# Patient Record
Sex: Male | Born: 1955
Health system: Southern US, Community
[De-identification: ages and names within clinical notes are randomized; demographics above are authoritative.]

## PROBLEM LIST (undated history)

## (undated) ENCOUNTER — Emergency Department (HOSPITAL_COMMUNITY): Payer: BLUE CROSS/BLUE SHIELD | Source: Home / Self Care

## (undated) DIAGNOSIS — E7800 Pure hypercholesterolemia, unspecified: Secondary | ICD-10-CM

---

## 2011-12-10 ENCOUNTER — Other Ambulatory Visit (HOSPITAL_COMMUNITY): Payer: Self-pay | Admitting: Rheumatology

## 2011-12-10 ENCOUNTER — Ambulatory Visit (HOSPITAL_COMMUNITY)
Admission: RE | Admit: 2011-12-10 | Discharge: 2011-12-10 | Disposition: A | Discharge status: Home / Self Care | Payer: BC Managed Care – PPO | Source: Ambulatory Visit | Attending: Rheumatology | Admitting: Rheumatology

## 2011-12-10 DIAGNOSIS — M545 Low back pain, unspecified: Secondary | ICD-10-CM

## 2011-12-10 DIAGNOSIS — M47817 Spondylosis without myelopathy or radiculopathy, lumbosacral region: Secondary | ICD-10-CM | POA: Insufficient documentation

## 2013-04-30 NOTE — H&P (Signed)
  NTS SOAP Note  Vital Signs:  Vitals as of: 04/30/2013: Systolic 136: Diastolic 86: Heart Rate 82: Temp 98.78F: Height 585ft 11in: Weight 229Lbs 0 Ounces: Pain Level 6: BMI 31.94  BMI : 31.94 kg/m2  Subjective: This [age] Years 7510 Months old Male presents for of    HERNIA: ,Has had a left inguinal hernia for some time now, but is increasing in size and causing him discomfort.  Does reduce on own when lying down.  Review of Symptoms:  Constitutional:  fatigue Head:unremarkable    Eyes:unremarkable   sinus problems Cardiovascular:  unremarkable   Respiratory:  dyspnea Gastrointestinal:  unremarkable   Genitourinary:unremarkable       joint pain dry Hematolgic/Lymphatic:unremarkable       hay fever   Past Medical History:    Reviewed  Past Medical History  Surgical History: right hand infection Medical Problems: psoriasis, high cholesterol Allergies: morphine Medications: embrel (which he is holding), crestor   Social History:Reviewed  Social History  Preferred Language: English Race:  White Ethnicity: Not Hispanic / Latino Age: [age] Years 10 Months Marital Status:  M Alcohol: occassionally Recreational drug(s): no   Smoking Status: Current every day smoker reviewed on 04/30/2013 Started Date: 04/03/1975 Packs per day: 1.00 Functional Status reviewed on 04/30/2013 ------------------------------------------------ Bathing: Normal Cooking: Normal Dressing: Normal Driving: Normal Eating: Normal Managing Meds: Normal Oral Care: Normal Shopping: Normal Toileting: Normal Transferring: Normal Walking: Normal Cognitive Status reviewed on 04/30/2013 ------------------------------------------------ Attention: Normal Decision Making: Normal Language: Normal Memory: Normal Motor: Normal Perception: Normal Problem Solving: Normal Visual and Spatial: Normal   Family History:  Reviewed  Family Health History Mother,  Living; Breast cancer;  Father, Living; Diabetes mellitus, unspecified type;     Objective Information: General:  Well appearing, well nourished in no distress. Heart:  RRR, no murmur Lungs:    CTA bilaterally, no wheezes, rhonchi, rales.  Breathing unlabored. Abdomen:Soft, NT/ND, normal bowel sounds, no HSM, no masses.  No peritoneal signs.  Large reducible left inguinal hernia.   WG:[id number]:unremarkable    Assessment:Left inguinal hernia  Diagnoses: 550.90 Inguinal hernia (Unilateral inguinal hernia, without obstruction or gangrene, not specified as recurrent)  Procedures: [id number] - OFFICE OUTPATIENT NEW 30 MINUTES    Plan:Scheduled for left inguinal herniorrhaphy with mesh on 05/11/13.   Patient Education:Alternative treatments to surgery were discussed with patient (and family).  Risks and benefits  of procedure bleeding, infection, mesh use, and the possibility of recurrence of the hernia were fully explained to the patient (and family) who gave informed consent. Patient/family questions were addressed.  Follow-up:Pending Surgery

## 2013-05-04 ENCOUNTER — Encounter (HOSPITAL_COMMUNITY): Payer: Self-pay | Admitting: Pharmacy Technician

## 2013-05-04 NOTE — Patient Instructions (Signed)
Marcha SoldersCharles C Granada  05/04/2013   Your procedure is scheduled on:  Monday,May 11, 2013  [facility] at [id number].  Call this number if you have problems the morning of surgery: [phone]   Remember:   Do not eat food or drink liquids after midnight.   Take these medicines the morning of surgery with A SIP OF WATER:   Do not wear jewelry, make-up or nail polish.  Do not wear lotions, powders, or perfumes. You may wear deodorant.  Do not shave 48 hours prior to surgery. Men may shave face and neck.  Do not bring valuables to the hospital.  [facility] is not responsible for any belongings or valuables.               Contacts, dentures or bridgework may not be worn into surgery.  Leave suitcase in the car. After surgery it may be brought to your room.  For patients admitted to the hospital, discharge time is determined by your treatment team.               Patients discharged the day of surgery will not be allowed to drive home.  Name and phone number of your driver: Family or friend  Special Instructions: Shower using CHG 2 nights before surgery and the night before surgery.  If you shower the day of surgery use CHG.  Use special wash - you have one bottle of CHG for all showers.  You should use approximately 1/3 of the bottle for each shower.   Please read over the following fact sheets that you were given: Pain Booklet, Coughing and Deep Breathing, MRSA Information, Surgical Site Infection Prevention, Anesthesia Post-op Instructions and Care and Recovery After Surgery  PATIENT INSTRUCTIONS POST-ANESTHESIA  IMMEDIATELY FOLLOWING SURGERY:  Do not drive or operate machinery for the first twenty four hours after surgery.  Do not make any important decisions for twenty four hours after surgery or while taking narcotic pain medications or sedatives.  If you develop intractable nausea and vomiting or a severe headache please notify your doctor immediately.  FOLLOW-UP:  Please make  an appointment with your surgeon as instructed. You do not need to follow up with anesthesia unless specifically instructed to do so.  WOUND CARE INSTRUCTIONS (if applicable):  Keep a dry clean dressing on the anesthesia/puncture wound site if there is drainage.  Once the wound has quit draining you may leave it open to air.  Generally you should leave the bandage intact for twenty four hours unless there is drainage.  If the epidural site drains for more than 36-48 hours please call the anesthesia department.  QUESTIONS?:  Please feel free to call your physician or the hospital operator if you have any questions, and they will be happy to assist you.     Hernia Repair Care After These instructions give you information on caring for yourself after your procedure. Your doctor may also give you more specific instructions. Call your doctor if you have any problems or questions after your procedure. HOME CARE   You may have changes in your poops (bowel movements).  You may have loose or watery poop (diarrhea).  You may be not able to poop.  Your bowels will slowly get back to normal.  Do not eat any food that makes you sick to your stomach (nauseous). Eat small meals 4 to 6 times a day instead of 3 large ones.  Do not drink pop. It will give you gas.  Do not drink alcohol.  Do not lift anything heavier than 10 pounds. This is about the weight of a gallon of milk.  Do not do anything that makes you very tired for at least 6 weeks.  Do not get your wound wet for 2 days.  You may take a sponge bath during this time.  After 2 days you may take a shower. Gently pat your surgical cut (incision) dry with a towel. Do not rub it.  For men: You may have been given an athletic supporter (scrotal support) before you left the hospital. It holds your scrotum and testicles closer to your body so there is no strain on your wound. Wear the supporter until your doctor tells you that you do not need it  anymore. GET HELP RIGHT AWAY IF:  You have watery poop, or cannot poop for more than 3 days.  You feel sick to your stomach or throw up (vomit) more than 2 or 3 times.  You have temperature by mouth above 102 F (38.9 C).  You see redness or puffiness (swelling) around your wound.  You see yellowish white fluid (pus) coming from your wound.  You see a bulge or bump in your lower belly (abdomen) or near your groin.  You develop a rash, trouble breathing, or any other symptoms from medicines taken. MAKE SURE YOU:  Understand these instructions.  Will watch your condition.  Will get help right away if your are not doing well or get worse.  Exit[employer] Patient Information 2014 [place], [place].

## 2013-05-05 ENCOUNTER — Encounter (HOSPITAL_COMMUNITY): Payer: Self-pay

## 2013-05-05 ENCOUNTER — Encounter (HOSPITAL_COMMUNITY)
Admission: RE | Admit: 2013-05-05 | Discharge: 2013-05-05 | Disposition: A | Discharge status: Home / Self Care | Payer: BC Managed Care – PPO | Source: Ambulatory Visit | Attending: General Surgery | Admitting: General Surgery

## 2013-05-05 DIAGNOSIS — Z01812 Encounter for preprocedural laboratory examination: Secondary | ICD-10-CM | POA: Insufficient documentation

## 2013-05-05 LAB — CBC WITH DIFFERENTIAL/PLATELET
Basophils Absolute: 0 10*3/uL (ref 0.0–0.1)
Basophils Relative: 1 % (ref 0–1)
Eosinophils Absolute: 0.3 10*3/uL (ref 0.0–0.7)
Eosinophils Relative: 4 % (ref 0–5)
HEMATOCRIT: 44 % (ref 39.0–52.0)
Hemoglobin: 15.4 g/dL (ref 13.0–17.0)
Lymphocytes Relative: 32 % (ref 12–46)
Lymphs Abs: 1.9 10*3/uL (ref 0.7–4.0)
MCH: 31.4 pg (ref 26.0–34.0)
MCHC: 35 g/dL (ref 30.0–36.0)
MCV: 89.6 fL (ref 78.0–100.0)
Monocytes Absolute: 0.4 10*3/uL (ref 0.1–1.0)
Monocytes Relative: 7 % (ref 3–12)
NEUTROS ABS: 3.3 10*3/uL (ref 1.7–7.7)
NEUTROS PCT: 56 % (ref 43–77)
Platelets: 180 10*3/uL (ref 150–400)
RBC: 4.91 MIL/uL (ref 4.22–5.81)
RDW: 13.1 % (ref 11.5–15.5)
WBC: 5.8 10*3/uL (ref 4.0–10.5)

## 2013-05-05 LAB — BASIC METABOLIC PANEL
BUN: 12 mg/dL (ref 6–23)
CO2: 26 mEq/L (ref 19–32)
CREATININE: 0.78 mg/dL (ref 0.50–1.35)
Calcium: 9.6 mg/dL (ref 8.4–10.5)
Chloride: 100 mEq/L (ref 96–112)
Glucose, Bld: 121 mg/dL — ABNORMAL HIGH (ref 70–99)
Potassium: 4.6 mEq/L (ref 3.7–5.3)
Sodium: 141 mEq/L (ref 137–147)

## 2013-05-11 ENCOUNTER — Ambulatory Visit (HOSPITAL_COMMUNITY)
Admission: RE | Admit: 2013-05-11 | Discharge: 2013-05-11 | Disposition: A | Discharge status: Home / Self Care | Payer: BC Managed Care – PPO | Source: Ambulatory Visit | Attending: General Surgery | Admitting: General Surgery

## 2013-05-11 ENCOUNTER — Encounter (HOSPITAL_COMMUNITY)
Admission: RE | Disposition: A | Discharge status: Home / Self Care | Payer: Self-pay | Source: Ambulatory Visit | Attending: General Surgery

## 2013-05-11 ENCOUNTER — Encounter (HOSPITAL_COMMUNITY): Payer: BC Managed Care – PPO | Admitting: Anesthesiology

## 2013-05-11 ENCOUNTER — Encounter (HOSPITAL_COMMUNITY): Payer: Self-pay | Admitting: *Deleted

## 2013-05-11 ENCOUNTER — Ambulatory Visit (HOSPITAL_COMMUNITY): Payer: BC Managed Care – PPO | Admitting: Anesthesiology

## 2013-05-11 DIAGNOSIS — F17200 Nicotine dependence, unspecified, uncomplicated: Secondary | ICD-10-CM | POA: Insufficient documentation

## 2013-05-11 DIAGNOSIS — K4090 Unilateral inguinal hernia, without obstruction or gangrene, not specified as recurrent: Secondary | ICD-10-CM | POA: Insufficient documentation

## 2013-05-11 HISTORY — PX: INSERTION OF MESH: SHX5868

## 2013-05-11 HISTORY — PX: INGUINAL HERNIA REPAIR: SHX194

## 2013-05-11 SURGERY — REPAIR, HERNIA, INGUINAL, ADULT
Anesthesia: General | Site: Groin | Laterality: Left

## 2013-05-11 MED ORDER — MIDAZOLAM HCL 5 MG/5ML IJ SOLN
INTRAMUSCULAR | Status: DC | PRN
  Administered 2013-05-11: 2 mg via INTRAVENOUS

## 2013-05-11 MED ORDER — FENTANYL CITRATE 0.05 MG/ML IJ SOLN
INTRAMUSCULAR | Status: DC | PRN
  Administered 2013-05-11 (×2): 25 ug via INTRAVENOUS
  Administered 2013-05-11 (×2): 50 ug via INTRAVENOUS
  Administered 2013-05-11 (×2): 25 ug via INTRAVENOUS

## 2013-05-11 MED ORDER — SODIUM CHLORIDE 0.9 % IR SOLN
Status: DC | PRN
  Administered 2013-05-11: 1000 mL

## 2013-05-11 MED ORDER — CHLORHEXIDINE GLUCONATE 4 % EX LIQD: 1.0000 "application " | Freq: Once | CUTANEOUS | Status: DC

## 2013-05-11 MED ORDER — LACTATED RINGERS IV SOLN
INTRAVENOUS | Status: DC
  Administered 2013-05-11: 1000 mL via INTRAVENOUS

## 2013-05-11 MED ORDER — FENTANYL CITRATE 0.05 MG/ML IJ SOLN: 25.0000 ug | INTRAMUSCULAR | Status: DC | PRN

## 2013-05-11 MED ORDER — GLYCOPYRROLATE 0.2 MG/ML IJ SOLN
0.2000 mg | Freq: Once | INTRAMUSCULAR | Status: AC
  Administered 2013-05-11: 0.2 mg via INTRAVENOUS

## 2013-05-11 MED ORDER — CEFAZOLIN SODIUM-DEXTROSE 2-3 GM-% IV SOLR
2.0000 g | INTRAVENOUS | Status: AC
  Administered 2013-05-11: 2 g via INTRAVENOUS
  Filled 2013-05-11: qty 50

## 2013-05-11 MED ORDER — MIDAZOLAM HCL 2 MG/2ML IJ SOLN
INTRAMUSCULAR | Status: AC
  Filled 2013-05-11: qty 2

## 2013-05-11 MED ORDER — OXYCODONE-ACETAMINOPHEN 7.5-325 MG PO TABS: 1.0000 | ORAL_TABLET | ORAL | Status: AC | PRN

## 2013-05-11 MED ORDER — KETOROLAC TROMETHAMINE 30 MG/ML IJ SOLN
30.0000 mg | Freq: Once | INTRAMUSCULAR | Status: AC
  Administered 2013-05-11: 30 mg via INTRAVENOUS
  Filled 2013-05-11: qty 1

## 2013-05-11 MED ORDER — BUPIVACAINE HCL (PF) 0.5 % IJ SOLN
INTRAMUSCULAR | Status: DC | PRN
  Administered 2013-05-11: 10 mL

## 2013-05-11 MED ORDER — GLYCOPYRROLATE 0.2 MG/ML IJ SOLN
INTRAMUSCULAR | Status: AC
  Filled 2013-05-11: qty 1

## 2013-05-11 MED ORDER — LIDOCAINE HCL 1 % IJ SOLN
INTRAMUSCULAR | Status: DC | PRN
  Administered 2013-05-11: 50 mg via INTRADERMAL

## 2013-05-11 MED ORDER — FENTANYL CITRATE 0.05 MG/ML IJ SOLN
25.0000 ug | INTRAMUSCULAR | Status: AC
  Administered 2013-05-11: 25 ug via INTRAVENOUS

## 2013-05-11 MED ORDER — FENTANYL CITRATE 0.05 MG/ML IJ SOLN
INTRAMUSCULAR | Status: AC
  Filled 2013-05-11: qty 2

## 2013-05-11 MED ORDER — BUPIVACAINE HCL (PF) 0.5 % IJ SOLN
INTRAMUSCULAR | Status: AC
  Filled 2013-05-11: qty 30

## 2013-05-11 MED ORDER — ONDANSETRON HCL 4 MG/2ML IJ SOLN
4.0000 mg | Freq: Once | INTRAMUSCULAR | Status: AC
  Administered 2013-05-11: 4 mg via INTRAVENOUS

## 2013-05-11 MED ORDER — ONDANSETRON HCL 4 MG/2ML IJ SOLN
INTRAMUSCULAR | Status: AC
  Filled 2013-05-11: qty 2

## 2013-05-11 MED ORDER — PROPOFOL 10 MG/ML IV EMUL
INTRAVENOUS | Status: AC
  Filled 2013-05-11: qty 20

## 2013-05-11 MED ORDER — PROPOFOL 10 MG/ML IV BOLUS
INTRAVENOUS | Status: DC | PRN
  Administered 2013-05-11: 180 mg via INTRAVENOUS

## 2013-05-11 MED ORDER — ONDANSETRON HCL 4 MG/2ML IJ SOLN: 4.0000 mg | Freq: Once | INTRAMUSCULAR | Status: DC | PRN

## 2013-05-11 MED ORDER — MIDAZOLAM HCL 2 MG/2ML IJ SOLN
1.0000 mg | INTRAMUSCULAR | Status: DC | PRN
  Administered 2013-05-11: 2 mg via INTRAVENOUS

## 2013-05-11 SURGICAL SUPPLY — 37 items
BAG HAMPER (MISCELLANEOUS) ×3 IMPLANT
CLOTH BEACON ORANGE TIMEOUT ST (SAFETY) ×3 IMPLANT
COVER LIGHT HANDLE STERIS (MISCELLANEOUS) ×6 IMPLANT
DECANTER SPIKE VIAL GLASS SM (MISCELLANEOUS) ×3 IMPLANT
DERMABOND ADVANCED (GAUZE/BANDAGES/DRESSINGS) ×2
DERMABOND ADVANCED .7 DNX12 (GAUZE/BANDAGES/DRESSINGS) ×1 IMPLANT
DRAIN PENROSE 3/4X12 (DRAIN) ×3 IMPLANT
ELECT REM PT RETURN 9FT ADLT (ELECTROSURGICAL) ×3
ELECTRODE REM PT RTRN 9FT ADLT (ELECTROSURGICAL) ×1 IMPLANT
GLOVE BIOGEL PI IND STRL 7.0 (GLOVE) ×1 IMPLANT
GLOVE BIOGEL PI IND STRL 7.5 (GLOVE) ×1 IMPLANT
GLOVE BIOGEL PI IND STRL 8 (GLOVE) ×1 IMPLANT
GLOVE BIOGEL PI INDICATOR 7.0 (GLOVE) ×2
GLOVE BIOGEL PI INDICATOR 7.5 (GLOVE) ×2
GLOVE BIOGEL PI INDICATOR 8 (GLOVE) ×2
GLOVE ECLIPSE 7.0 STRL STRAW (GLOVE) ×6 IMPLANT
GLOVE ECLIPSE 7.5 STRL STRAW (GLOVE) ×3 IMPLANT
GLOVE EXAM NITRILE LRG STRL (GLOVE) ×3 IMPLANT
GOWN STRL REUS W/TWL LRG LVL3 (GOWN DISPOSABLE) ×9 IMPLANT
INST SET MINOR GENERAL (KITS) ×3 IMPLANT
KIT ROOM TURNOVER APOR (KITS) ×3 IMPLANT
MANIFOLD NEPTUNE II (INSTRUMENTS) ×3 IMPLANT
MESH HERNIA 1.6X1.9 PLUG LRG (Mesh General) ×1 IMPLANT
MESH HERNIA PLUG LRG (Mesh General) ×2 IMPLANT
NS IRRIG 1000ML POUR BTL (IV SOLUTION) ×3 IMPLANT
PACK MINOR (CUSTOM PROCEDURE TRAY) ×3 IMPLANT
PAD ARMBOARD 7.5X6 YLW CONV (MISCELLANEOUS) ×3 IMPLANT
SET BASIN LINEN APH (SET/KITS/TRAYS/PACK) ×3 IMPLANT
SOL PREP PROV IODINE SCRUB 4OZ (MISCELLANEOUS) ×3 IMPLANT
SUT NOVA NAB GS-22 2 2-0 T-19 (SUTURE) ×9 IMPLANT
SUT VIC AB 2-0 CT1 27 (SUTURE) ×2
SUT VIC AB 2-0 CT1 TAPERPNT 27 (SUTURE) ×1 IMPLANT
SUT VIC AB 3-0 SH 27 (SUTURE) ×2
SUT VIC AB 3-0 SH 27X BRD (SUTURE) ×1 IMPLANT
SUT VIC AB 4-0 PS2 27 (SUTURE) ×3 IMPLANT
SUT VICRYL AB 3 0 TIES (SUTURE) ×3 IMPLANT
SYR CONTROL 10ML LL (SYRINGE) ×3 IMPLANT

## 2013-05-11 NOTE — Anesthesia Postprocedure Evaluation (Signed)
  Anesthesia Post-op Note  Patient: Mark Rangel  Procedure(s) Performed: Procedure(s): HERNIA REPAIR INGUINAL ADULT (Left) INSERTION OF MESH (Left)  Patient Location: PACU  Anesthesia Type:General  Level of Consciousness: awake, alert , oriented and patient cooperative  Airway and Oxygen Therapy: Patient Spontanous Breathing  Post-op Pain: 3 /10, mild  Post-op Assessment: Post-op Vital signs reviewed, Patient's Cardiovascular Status Stable, Respiratory Function Stable, Patent Airway, No signs of Nausea or vomiting and Pain level controlled  Post-op Vital Signs: Reviewed and stable  Complications: No apparent anesthesia complications

## 2013-05-11 NOTE — Op Note (Signed)
Patient:  Mark Rangel  DOB:  20-Jun-1955  MRN:  [id number]   Preop Diagnosis:  Left inguinal hernia  Postop Diagnosis:  Same  Procedure:  Left inguinal herniorrhaphy with mesh  Surgeon:  Franky MachoMark Caleb Prigmore, M.D.  Anes:  General  Indications:  Patient is a 58 year old white male who presents with a symptomatic left inguinal hernia. The risks and benefits of the procedure including bleeding, infection, pain, and the possibility of recurrence of the hernia were fully explained to the patient, who gave informed consent.  Procedure note:  The patient was placed in the supine position. After general anesthesia was administered, the left groin region was prepped and draped using usual sterile technique with Betadine. Surgical site confirmation was performed.  A transverse incision was made the left groin region down to the external oblique aponeuroses. The aponeuroses was incised to the external ring. A Penrose drain was placed around the spermatic cord. The vas deferens was noted within the spermatic cord. The patient had a significant lipoma of the cord, but this was not fully dissected as a significant amount of the vasculature was noted to be within it. The ilioinguinal nerve was identified retracted superiorly from the operative field. The patient had a large indirect hernia sac. This was freed away from the spermatic cord and inverted at the peritoneal reflection. A large size Bard mesh PerFix plug was then inserted and secured to the transversalis fascia using 2-0 Novafil interrupted sutures. An onlay patch was then placed along the floor of inguinal canal and secured superiorly to the conjoined tendon and inferiorly to the shelving edge of Poupart's ligament using 2-0 Novafil interrupted sutures. The internal ring was recreated using a 2-0 Novafil interrupted suture. The external oblique aponeuroses was reapproximated using a 2-0 Vicryl running suture. The subcutaneous layer was reapproximated  using 3-0 Vicryl interrupted suture. The skin was closed using a 4-0 Vicryl subcuticular suture. 0.5% Sensorcaine was instilled the surrounding wound. Dermabond was then applied.  All tape and needle counts were correct at the end of the procedure. Patient was extubated in the operating room and transferred to PACU in stable condition.  Complications:  None  EBL:  Minimal  Specimen:  None

## 2013-05-11 NOTE — Discharge Instructions (Signed)
Inguinal Hernia, Adult  °Care After °Refer to this sheet in the next few weeks. These discharge instructions provide you with general information on caring for yourself after you leave the hospital. Your caregiver may also give you specific instructions. Your treatment has been planned according to the most current medical practices available, but unavoidable complications sometimes occur. If you have any problems or questions after discharge, please call your caregiver. °HOME CARE INSTRUCTIONS °· Put ice on the operative site. °· Put ice in a plastic bag. °· Place a towel between your skin and the bag. °· Leave the ice on for 15-20 minutes at a time, 03-04 times a day while awake. °· Change bandages (dressings) as directed. °· Keep the wound dry and clean. The wound may be washed gently with soap and water. Gently blot or dab the wound dry. It is okay to take showers 24 to 48 hours after surgery. Do not take baths, use swimming pools, or use hot tubs for 10 days, or as directed by your caregiver. °· Only take over-the-counter or prescription medicines for pain, discomfort, or fever as directed by your caregiver. °· Continue your normal diet as directed. °· Do not lift anything more than 10 pounds or play contact sports for 3 weeks, or as directed. °SEEK MEDICAL CARE IF: °· There is redness, swelling, or increasing pain in the wound. °· There is fluid (pus) coming from the wound. °· There is drainage from a wound lasting longer than 1 day. °· You have an oral temperature above 102° F (38.9° C). °· You notice a bad smell coming from the wound or dressing. °· The wound breaks open after the stitches (sutures) have been removed. °· You notice increasing pain in the shoulders (shoulder strap areas). °· You develop dizzy episodes or fainting while standing. °· You feel sick to your stomach (nauseous) or throw up (vomit). °SEEK IMMEDIATE MEDICAL CARE IF: °· You develop a rash. °· You have difficulty breathing. °· You  develop a reaction or have side effects to medicines you were given. °MAKE SURE YOU:  °· Understand these instructions. °· Will watch your condition. °· Will get help right away if you are not doing well or get worse. °Document Released: 04/19/2006 Document Revised: 06/11/2011 Document Reviewed: 02/16/2009 °ExitCare® Patient Information ©2014 [employer]. ° °

## 2013-05-11 NOTE — Anesthesia Preprocedure Evaluation (Signed)
Anesthesia Evaluation  Patient identified by MRN, date of birth, ID band Patient awake    Reviewed: Allergy & Precautions, H&P , NPO status , Patient's Chart, lab work & pertinent test results  Airway Mallampati: II TM Distance: >3 FB     Dental  (+) Edentulous Upper and Edentulous Lower   Pulmonary Current Smoker,  breath sounds clear to auscultation        Cardiovascular negative cardio ROS  Rhythm:Regular Rate:Normal     Neuro/Psych    GI/Hepatic negative GI ROS,   Endo/Other    Renal/GU      Musculoskeletal   Abdominal   Peds  Hematology   Anesthesia Other Findings   Reproductive/Obstetrics                           Anesthesia Physical Anesthesia Plan  ASA: II  Anesthesia Plan: General   Post-op Pain Management:    Induction: Intravenous  Airway Management Planned: LMA  Additional Equipment:   Intra-op Plan:   Post-operative Plan: Extubation in OR  Informed Consent: I have reviewed the patients History and Physical, chart, labs and discussed the procedure including the risks, benefits and alternatives for the proposed anesthesia with the patient or authorized representative who has indicated his/her understanding and acceptance.     Plan Discussed with:   Anesthesia Plan Comments:         Anesthesia Quick Evaluation

## 2013-05-11 NOTE — Transfer of Care (Signed)
Immediate Anesthesia Transfer of Care Note  Patient: Mark Rangel  Procedure(s) Performed: Procedure(s): HERNIA REPAIR INGUINAL ADULT (Left) INSERTION OF MESH (Left)  Patient Location: PACU  Anesthesia Type:General  Level of Consciousness: awake and patient cooperative  Airway & Oxygen Therapy: Patient Spontanous Breathing and Patient connected to face mask oxygen  Post-op Assessment: Report given to PACU RN, Post -op Vital signs reviewed and stable and Patient moving all extremities  Post vital signs: Reviewed and stable  Complications: No apparent anesthesia complications

## 2013-05-11 NOTE — Interval H&P Note (Signed)
History and Physical Interval Note:  05/11/2013 9:15 AM  Mark Rangel  has presented today for surgery, with the diagnosis of left inguinal hernia  The various methods of treatment have been discussed with the patient and family. After consideration of risks, benefits and other options for treatment, the patient has consented to  Procedure(s): HERNIA REPAIR INGUINAL ADULT (Left) as a surgical intervention .  The patient's history has been reviewed, patient examined, no change in status, stable for surgery.  I have reviewed the patient's chart and labs.  Questions were answered to the patient's satisfaction.     Franky MachoJENKINS,Etrulia Zarr A

## 2013-05-11 NOTE — Anesthesia Procedure Notes (Signed)
Procedure Name: LMA Insertion Date/Time: 05/11/2013 9:42 AM Performed by: Despina HiddenIDACAVAGE, Ludmilla Mcgillis J Pre-anesthesia Checklist: Patient being monitored, Suction available, Emergency Drugs available and Patient identified Patient Re-evaluated:Patient Re-evaluated prior to inductionOxygen Delivery Method: Circle system utilized Preoxygenation: Pre-oxygenation with 100% oxygen Intubation Type: IV induction Ventilation: Mask ventilation without difficulty LMA: LMA inserted LMA Size: 4.0 Tube type: Oral Number of attempts: 2 Placement Confirmation: breath sounds checked- equal and bilateral and positive ETCO2 Tube secured with: Tape Dental Injury: Teeth and Oropharynx as per pre-operative assessment

## 2013-05-12 ENCOUNTER — Encounter (HOSPITAL_COMMUNITY): Payer: Self-pay | Admitting: General Surgery

## 2016-05-11 ENCOUNTER — Other Ambulatory Visit (HOSPITAL_COMMUNITY): Payer: Self-pay | Admitting: Pulmonary Disease

## 2016-05-11 DIAGNOSIS — M545 Low back pain: Secondary | ICD-10-CM

## 2016-05-18 ENCOUNTER — Ambulatory Visit (HOSPITAL_COMMUNITY): Payer: BLUE CROSS/BLUE SHIELD

## 2016-05-28 ENCOUNTER — Ambulatory Visit (HOSPITAL_COMMUNITY)
Admission: RE | Admit: 2016-05-28 | Discharge: 2016-05-28 | Disposition: A | Discharge status: Home / Self Care | Payer: BLUE CROSS/BLUE SHIELD | Source: Ambulatory Visit | Attending: Pulmonary Disease | Admitting: Pulmonary Disease

## 2016-05-28 DIAGNOSIS — M546 Pain in thoracic spine: Secondary | ICD-10-CM | POA: Insufficient documentation

## 2016-05-28 DIAGNOSIS — M545 Low back pain: Secondary | ICD-10-CM

## 2017-01-02 ENCOUNTER — Emergency Department (HOSPITAL_COMMUNITY)
Admission: EM | Admit: 2017-01-02 | Discharge: 2017-01-02 | Disposition: A | Discharge status: Home / Self Care | Payer: BLUE CROSS/BLUE SHIELD | Attending: Emergency Medicine | Admitting: Emergency Medicine

## 2017-01-02 ENCOUNTER — Encounter (HOSPITAL_COMMUNITY): Payer: Self-pay | Admitting: Emergency Medicine

## 2017-01-02 ENCOUNTER — Emergency Department (HOSPITAL_COMMUNITY): Payer: BLUE CROSS/BLUE SHIELD

## 2017-01-02 DIAGNOSIS — Y998 Other external cause status: Secondary | ICD-10-CM | POA: Diagnosis not present

## 2017-01-02 DIAGNOSIS — Z79899 Other long term (current) drug therapy: Secondary | ICD-10-CM | POA: Insufficient documentation

## 2017-01-02 DIAGNOSIS — S199XXA Unspecified injury of neck, initial encounter: Secondary | ICD-10-CM | POA: Insufficient documentation

## 2017-01-02 DIAGNOSIS — F17210 Nicotine dependence, cigarettes, uncomplicated: Secondary | ICD-10-CM | POA: Insufficient documentation

## 2017-01-02 DIAGNOSIS — Y9289 Other specified places as the place of occurrence of the external cause: Secondary | ICD-10-CM | POA: Insufficient documentation

## 2017-01-02 DIAGNOSIS — Y9389 Activity, other specified: Secondary | ICD-10-CM | POA: Insufficient documentation

## 2017-01-02 DIAGNOSIS — R0789 Other chest pain: Secondary | ICD-10-CM | POA: Insufficient documentation

## 2017-01-02 HISTORY — DX: Pure hypercholesterolemia, unspecified: E78.00

## 2017-01-02 MED ORDER — CYCLOBENZAPRINE HCL 10 MG PO TABS: 10.0000 mg | ORAL_TABLET | Freq: Two times a day (BID) | ORAL | 0 refills | Status: AC | PRN

## 2017-01-02 MED ORDER — IBUPROFEN 600 MG PO TABS: 600.0000 mg | ORAL_TABLET | Freq: Four times a day (QID) | ORAL | 0 refills | Status: AC | PRN
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
## 2017-05-30 ENCOUNTER — Encounter (HOSPITAL_COMMUNITY): Admission: EM | Disposition: E | Payer: Self-pay | Source: Home / Self Care | Attending: Cardiovascular Disease

## 2017-05-30 ENCOUNTER — Emergency Department (HOSPITAL_COMMUNITY): Payer: BLUE CROSS/BLUE SHIELD | Admitting: Certified Registered"

## 2017-05-30 ENCOUNTER — Emergency Department (HOSPITAL_COMMUNITY): Payer: BLUE CROSS/BLUE SHIELD

## 2017-05-30 ENCOUNTER — Encounter (HOSPITAL_COMMUNITY): Payer: Self-pay | Admitting: Certified Registered"

## 2017-05-30 ENCOUNTER — Emergency Department (HOSPITAL_COMMUNITY): Payer: BLUE CROSS/BLUE SHIELD | Admitting: Certified Registered Nurse Anesthetist

## 2017-05-30 ENCOUNTER — Inpatient Hospital Stay (HOSPITAL_COMMUNITY)
Admission: EM | Admit: 2017-05-30 | Discharge: 2017-07-01 | DRG: 215 | Disposition: E | Payer: BLUE CROSS/BLUE SHIELD | Attending: Surgery | Admitting: Surgery

## 2017-05-30 ENCOUNTER — Inpatient Hospital Stay (HOSPITAL_COMMUNITY): Payer: BLUE CROSS/BLUE SHIELD

## 2017-05-30 DIAGNOSIS — I313 Pericardial effusion (noninflammatory): Secondary | ICD-10-CM

## 2017-05-30 DIAGNOSIS — I2119 ST elevation (STEMI) myocardial infarction involving other coronary artery of inferior wall: Secondary | ICD-10-CM | POA: Diagnosis not present

## 2017-05-30 DIAGNOSIS — G9340 Encephalopathy, unspecified: Secondary | ICD-10-CM | POA: Diagnosis not present

## 2017-05-30 DIAGNOSIS — Z885 Allergy status to narcotic agent status: Secondary | ICD-10-CM | POA: Diagnosis not present

## 2017-05-30 DIAGNOSIS — Z9289 Personal history of other medical treatment: Secondary | ICD-10-CM

## 2017-05-30 DIAGNOSIS — E871 Hypo-osmolality and hyponatremia: Secondary | ICD-10-CM | POA: Diagnosis present

## 2017-05-30 DIAGNOSIS — E7800 Pure hypercholesterolemia, unspecified: Secondary | ICD-10-CM | POA: Diagnosis present

## 2017-05-30 DIAGNOSIS — I9581 Postprocedural hypotension: Secondary | ICD-10-CM | POA: Diagnosis not present

## 2017-05-30 DIAGNOSIS — E669 Obesity, unspecified: Secondary | ICD-10-CM | POA: Diagnosis present

## 2017-05-30 DIAGNOSIS — E119 Type 2 diabetes mellitus without complications: Secondary | ICD-10-CM | POA: Diagnosis present

## 2017-05-30 DIAGNOSIS — G9341 Metabolic encephalopathy: Secondary | ICD-10-CM | POA: Diagnosis present

## 2017-05-30 DIAGNOSIS — I2121 ST elevation (STEMI) myocardial infarction involving left circumflex coronary artery: Principal | ICD-10-CM | POA: Diagnosis present

## 2017-05-30 DIAGNOSIS — Z7984 Long term (current) use of oral hypoglycemic drugs: Secondary | ICD-10-CM

## 2017-05-30 DIAGNOSIS — R570 Cardiogenic shock: Secondary | ICD-10-CM | POA: Diagnosis present

## 2017-05-30 DIAGNOSIS — I2510 Atherosclerotic heart disease of native coronary artery without angina pectoris: Secondary | ICD-10-CM

## 2017-05-30 DIAGNOSIS — Z79899 Other long term (current) drug therapy: Secondary | ICD-10-CM | POA: Diagnosis not present

## 2017-05-30 DIAGNOSIS — F17210 Nicotine dependence, cigarettes, uncomplicated: Secondary | ICD-10-CM | POA: Diagnosis present

## 2017-05-30 DIAGNOSIS — I340 Nonrheumatic mitral (valve) insufficiency: Secondary | ICD-10-CM

## 2017-05-30 DIAGNOSIS — N179 Acute kidney failure, unspecified: Secondary | ICD-10-CM | POA: Diagnosis present

## 2017-05-30 DIAGNOSIS — J439 Emphysema, unspecified: Secondary | ICD-10-CM | POA: Diagnosis present

## 2017-05-30 DIAGNOSIS — E872 Acidosis: Secondary | ICD-10-CM | POA: Diagnosis present

## 2017-05-30 DIAGNOSIS — R000 Tachycardia, unspecified: Secondary | ICD-10-CM | POA: Diagnosis present

## 2017-05-30 DIAGNOSIS — Z791 Long term (current) use of non-steroidal anti-inflammatories (NSAID): Secondary | ICD-10-CM | POA: Diagnosis not present

## 2017-05-30 DIAGNOSIS — Z789 Other specified health status: Secondary | ICD-10-CM

## 2017-05-30 DIAGNOSIS — J810 Acute pulmonary edema: Secondary | ICD-10-CM | POA: Diagnosis present

## 2017-05-30 DIAGNOSIS — Z952 Presence of prosthetic heart valve: Secondary | ICD-10-CM

## 2017-05-30 DIAGNOSIS — I10 Essential (primary) hypertension: Secondary | ICD-10-CM | POA: Diagnosis present

## 2017-05-30 DIAGNOSIS — R0989 Other specified symptoms and signs involving the circulatory and respiratory systems: Secondary | ICD-10-CM

## 2017-05-30 DIAGNOSIS — I213 ST elevation (STEMI) myocardial infarction of unspecified site: Secondary | ICD-10-CM | POA: Diagnosis not present

## 2017-05-30 DIAGNOSIS — E785 Hyperlipidemia, unspecified: Secondary | ICD-10-CM | POA: Diagnosis present

## 2017-05-30 DIAGNOSIS — I512 Rupture of papillary muscle, not elsewhere classified: Secondary | ICD-10-CM | POA: Diagnosis present

## 2017-05-30 DIAGNOSIS — E876 Hypokalemia: Secondary | ICD-10-CM | POA: Diagnosis present

## 2017-05-30 DIAGNOSIS — Z6831 Body mass index (BMI) 31.0-31.9, adult: Secondary | ICD-10-CM

## 2017-05-30 DIAGNOSIS — J9601 Acute respiratory failure with hypoxia: Secondary | ICD-10-CM | POA: Diagnosis present

## 2017-05-30 HISTORY — PX: MITRAL VALVE REPLACEMENT: SHX147

## 2017-05-30 HISTORY — PX: VENTRICULAR ASSIST DEVICE INSERTION: CATH118273

## 2017-05-30 HISTORY — PX: LEFT HEART CATH AND CORONARY ANGIOGRAPHY: CATH118249

## 2017-05-30 LAB — POCT I-STAT 3, ART BLOOD GAS (G3+)
ACID-BASE DEFICIT: 14 mmol/L — AB (ref 0.0–2.0)
ACID-BASE DEFICIT: 12 mmol/L — AB (ref 0.0–2.0)
ACID-BASE DEFICIT: 7 mmol/L — AB (ref 0.0–2.0)
ACID-BASE DEFICIT: 4 mmol/L — AB (ref 0.0–2.0)
Acid-base deficit: 6 mmol/L — ABNORMAL HIGH (ref 0.0–2.0)
Acid-base deficit: 10 mmol/L — ABNORMAL HIGH (ref 0.0–2.0)
Acid-base deficit: 5 mmol/L — ABNORMAL HIGH (ref 0.0–2.0)
Acid-base deficit: 5 mmol/L — ABNORMAL HIGH (ref 0.0–2.0)
Acid-base deficit: 8 mmol/L — ABNORMAL HIGH (ref 0.0–2.0)
Acid-base deficit: 7 mmol/L — ABNORMAL HIGH (ref 0.0–2.0)
BICARBONATE: 22.6 mmol/L (ref 20.0–28.0)
BICARBONATE: 21.7 mmol/L (ref 20.0–28.0)
BICARBONATE: 23.4 mmol/L (ref 20.0–28.0)
BICARBONATE: 21.4 mmol/L (ref 20.0–28.0)
Bicarbonate: 15.7 mmol/L — ABNORMAL LOW (ref 20.0–28.0)
Bicarbonate: 17.1 mmol/L — ABNORMAL LOW (ref 20.0–28.0)
Bicarbonate: 22.4 mmol/L (ref 20.0–28.0)
Bicarbonate: 18.9 mmol/L — ABNORMAL LOW (ref 20.0–28.0)
Bicarbonate: 23 mmol/L (ref 20.0–28.0)
Bicarbonate: 19.6 mmol/L — ABNORMAL LOW (ref 20.0–28.0)
O2 SAT: 93 %
O2 SAT: 97 %
O2 SAT: 100 %
O2 SAT: 96 %
O2 Saturation: 77 %
O2 Saturation: 85 %
O2 Saturation: 94 %
O2 Saturation: 91 %
O2 Saturation: 94 %
O2 Saturation: 100 %
PCO2 ART: 49.1 mmHg — AB (ref 32.0–48.0)
PCO2 ART: 56.6 mmHg — AB (ref 32.0–48.0)
PCO2 ART: 54.3 mmHg — AB (ref 32.0–48.0)
PCO2 ART: 58.6 mmHg — AB (ref 32.0–48.0)
PCO2 ART: 46 mmHg (ref 32.0–48.0)
PH ART: 7.099 — AB (ref 7.350–7.450)
PH ART: 7.151 — AB (ref 7.350–7.450)
PH ART: 7.205 — AB (ref 7.350–7.450)
PH ART: 7.199 — AB (ref 7.350–7.450)
PH ART: 7.249 — AB (ref 7.350–7.450)
PH ART: 7.239 — AB (ref 7.350–7.450)
PO2 ART: 57 mmHg — AB (ref 83.0–108.0)
PO2 ART: 75 mmHg — AB (ref 83.0–108.0)
PO2 ART: 107 mmHg (ref 83.0–108.0)
PO2 ART: 199 mmHg — AB (ref 83.0–108.0)
Patient temperature: 37.1
TCO2: 17 mmol/L — ABNORMAL LOW (ref 22–32)
TCO2: 19 mmol/L — ABNORMAL LOW (ref 22–32)
TCO2: 24 mmol/L (ref 22–32)
TCO2: 21 mmol/L — AB (ref 22–32)
TCO2: 24 mmol/L (ref 22–32)
TCO2: 23 mmol/L (ref 22–32)
TCO2: 25 mmol/L (ref 22–32)
TCO2: 25 mmol/L (ref 22–32)
TCO2: 23 mmol/L (ref 22–32)
TCO2: 21 mmol/L — ABNORMAL LOW (ref 22–32)
pCO2 arterial: 50.6 mmHg — ABNORMAL HIGH (ref 32.0–48.0)
pCO2 arterial: 61.2 mmHg — ABNORMAL HIGH (ref 32.0–48.0)
pCO2 arterial: 48.1 mmHg — ABNORMAL HIGH (ref 32.0–48.0)
pCO2 arterial: 52.6 mmHg — ABNORMAL HIGH (ref 32.0–48.0)
pCO2 arterial: 60 mmHg — ABNORMAL HIGH (ref 32.0–48.0)
pH, Arterial: 7.151 — CL (ref 7.350–7.450)
pH, Arterial: 7.176 — CL (ref 7.350–7.450)
pH, Arterial: 7.262 — ABNORMAL LOW (ref 7.350–7.450)
pH, Arterial: 7.17 — CL (ref 7.350–7.450)
pO2, Arterial: 61 mmHg — ABNORMAL LOW (ref 83.0–108.0)
pO2, Arterial: 85 mmHg (ref 83.0–108.0)
pO2, Arterial: 94 mmHg (ref 83.0–108.0)
pO2, Arterial: 118 mmHg — ABNORMAL HIGH (ref 83.0–108.0)
pO2, Arterial: 409 mmHg — ABNORMAL HIGH (ref 83.0–108.0)
pO2, Arterial: 81 mmHg — ABNORMAL LOW (ref 83.0–108.0)

## 2017-05-30 LAB — CBC WITH DIFFERENTIAL/PLATELET
BASOS ABS: 0 10*3/uL (ref 0.0–0.1)
Basophils Relative: 0 %
Eosinophils Absolute: 0.2 10*3/uL (ref 0.0–0.7)
Eosinophils Relative: 1 %
HEMATOCRIT: 41.9 % (ref 39.0–52.0)
HEMOGLOBIN: 13.7 g/dL (ref 13.0–17.0)
LYMPHS PCT: 23 %
Lymphs Abs: 3.9 10*3/uL (ref 0.7–4.0)
MCH: 30.7 pg (ref 26.0–34.0)
MCHC: 32.7 g/dL (ref 30.0–36.0)
MCV: 93.9 fL (ref 78.0–100.0)
Monocytes Absolute: 1.4 10*3/uL — ABNORMAL HIGH (ref 0.1–1.0)
Monocytes Relative: 8 %
NEUTROS ABS: 11.5 10*3/uL — AB (ref 1.7–7.7)
NEUTROS PCT: 68 %
Platelets: 233 10*3/uL (ref 150–400)
RBC: 4.46 MIL/uL (ref 4.22–5.81)
RDW: 13.7 % (ref 11.5–15.5)
WBC: 16.9 10*3/uL — AB (ref 4.0–10.5)

## 2017-05-30 LAB — POCT I-STAT, CHEM 8
BUN: 27 mg/dL — AB (ref 6–20)
BUN: 28 mg/dL — AB (ref 6–20)
BUN: 24 mg/dL — AB (ref 6–20)
BUN: 24 mg/dL — ABNORMAL HIGH (ref 6–20)
BUN: 26 mg/dL — AB (ref 6–20)
BUN: 26 mg/dL — ABNORMAL HIGH (ref 6–20)
BUN: 27 mg/dL — ABNORMAL HIGH (ref 6–20)
BUN: 26 mg/dL — AB (ref 6–20)
CALCIUM ION: 0.96 mmol/L — AB (ref 1.15–1.40)
CALCIUM ION: 0.84 mmol/L — AB (ref 1.15–1.40)
CALCIUM ION: 0.88 mmol/L — AB (ref 1.15–1.40)
CALCIUM ION: 0.86 mmol/L — AB (ref 1.15–1.40)
CALCIUM ION: 0.83 mmol/L — AB (ref 1.15–1.40)
CHLORIDE: 100 mmol/L — AB (ref 101–111)
CREATININE: 1.3 mg/dL — AB (ref 0.61–1.24)
CREATININE: 1.5 mg/dL — AB (ref 0.61–1.24)
CREATININE: 1.5 mg/dL — AB (ref 0.61–1.24)
CREATININE: 1.5 mg/dL — AB (ref 0.61–1.24)
CREATININE: 1.6 mg/dL — AB (ref 0.61–1.24)
Calcium, Ion: 0.98 mmol/L — ABNORMAL LOW (ref 1.15–1.40)
Calcium, Ion: 1.08 mmol/L — ABNORMAL LOW (ref 1.15–1.40)
Calcium, Ion: 1.04 mmol/L — ABNORMAL LOW (ref 1.15–1.40)
Chloride: 101 mmol/L (ref 101–111)
Chloride: 99 mmol/L — ABNORMAL LOW (ref 101–111)
Chloride: 98 mmol/L — ABNORMAL LOW (ref 101–111)
Chloride: 98 mmol/L — ABNORMAL LOW (ref 101–111)
Chloride: 99 mmol/L — ABNORMAL LOW (ref 101–111)
Chloride: 100 mmol/L — ABNORMAL LOW (ref 101–111)
Chloride: 100 mmol/L — ABNORMAL LOW (ref 101–111)
Creatinine, Ser: 1.4 mg/dL — ABNORMAL HIGH (ref 0.61–1.24)
Creatinine, Ser: 1.5 mg/dL — ABNORMAL HIGH (ref 0.61–1.24)
Creatinine, Ser: 1.4 mg/dL — ABNORMAL HIGH (ref 0.61–1.24)
GLUCOSE: 501 mg/dL — AB (ref 65–99)
GLUCOSE: 449 mg/dL — AB (ref 65–99)
GLUCOSE: 466 mg/dL — AB (ref 65–99)
GLUCOSE: 389 mg/dL — AB (ref 65–99)
Glucose, Bld: 531 mg/dL (ref 65–99)
Glucose, Bld: 424 mg/dL — ABNORMAL HIGH (ref 65–99)
Glucose, Bld: 375 mg/dL — ABNORMAL HIGH (ref 65–99)
Glucose, Bld: 366 mg/dL — ABNORMAL HIGH (ref 65–99)
HCT: 42 % (ref 39.0–52.0)
HCT: 49 % (ref 39.0–52.0)
HCT: 26 % — ABNORMAL LOW (ref 39.0–52.0)
HCT: 30 % — ABNORMAL LOW (ref 39.0–52.0)
HCT: 26 % — ABNORMAL LOW (ref 39.0–52.0)
HEMATOCRIT: 26 % — AB (ref 39.0–52.0)
HEMATOCRIT: 31 % — AB (ref 39.0–52.0)
HEMATOCRIT: 32 % — AB (ref 39.0–52.0)
HEMOGLOBIN: 10.2 g/dL — AB (ref 13.0–17.0)
HEMOGLOBIN: 8.8 g/dL — AB (ref 13.0–17.0)
HEMOGLOBIN: 10.9 g/dL — AB (ref 13.0–17.0)
Hemoglobin: 14.3 g/dL (ref 13.0–17.0)
Hemoglobin: 16.7 g/dL (ref 13.0–17.0)
Hemoglobin: 8.8 g/dL — ABNORMAL LOW (ref 13.0–17.0)
Hemoglobin: 8.8 g/dL — ABNORMAL LOW (ref 13.0–17.0)
Hemoglobin: 10.5 g/dL — ABNORMAL LOW (ref 13.0–17.0)
POTASSIUM: 4.1 mmol/L (ref 3.5–5.1)
POTASSIUM: 3.2 mmol/L — AB (ref 3.5–5.1)
POTASSIUM: 3 mmol/L — AB (ref 3.5–5.1)
Potassium: 3.7 mmol/L (ref 3.5–5.1)
Potassium: 4.1 mmol/L (ref 3.5–5.1)
Potassium: 3.4 mmol/L — ABNORMAL LOW (ref 3.5–5.1)
Potassium: 3.6 mmol/L (ref 3.5–5.1)
Potassium: 3.3 mmol/L — ABNORMAL LOW (ref 3.5–5.1)
SODIUM: 140 mmol/L (ref 135–145)
SODIUM: 142 mmol/L (ref 135–145)
SODIUM: 144 mmol/L (ref 135–145)
Sodium: 137 mmol/L (ref 135–145)
Sodium: 139 mmol/L (ref 135–145)
Sodium: 140 mmol/L (ref 135–145)
Sodium: 141 mmol/L (ref 135–145)
Sodium: 140 mmol/L (ref 135–145)
TCO2: 23 mmol/L (ref 22–32)
TCO2: 23 mmol/L (ref 22–32)
TCO2: 25 mmol/L (ref 22–32)
TCO2: 26 mmol/L (ref 22–32)
TCO2: 25 mmol/L (ref 22–32)
TCO2: 24 mmol/L (ref 22–32)
TCO2: 23 mmol/L (ref 22–32)
TCO2: 23 mmol/L (ref 22–32)

## 2017-05-30 LAB — COMPREHENSIVE METABOLIC PANEL
ALT: 45 U/L (ref 17–63)
AST: 68 U/L — AB (ref 15–41)
Albumin: 3.2 g/dL — ABNORMAL LOW (ref 3.5–5.0)
Alkaline Phosphatase: 61 U/L (ref 38–126)
Anion gap: 19 — ABNORMAL HIGH (ref 5–15)
BUN: 18 mg/dL (ref 6–20)
CHLORIDE: 99 mmol/L — AB (ref 101–111)
CO2: 16 mmol/L — AB (ref 22–32)
CREATININE: 1.47 mg/dL — AB (ref 0.61–1.24)
Calcium: 8.5 mg/dL — ABNORMAL LOW (ref 8.9–10.3)
GFR calc non Af Amer: 50 mL/min — ABNORMAL LOW (ref >60)
GFR, EST AFRICAN AMERICAN: 58 mL/min — AB (ref >60)
Glucose, Bld: 440 mg/dL — ABNORMAL HIGH (ref 65–99)
Potassium: 3.4 mmol/L — ABNORMAL LOW (ref 3.5–5.1)
SODIUM: 134 mmol/L — AB (ref 135–145)
Total Bilirubin: 1.2 mg/dL (ref 0.3–1.2)
Total Protein: 5.9 g/dL — ABNORMAL LOW (ref 6.5–8.1)

## 2017-05-30 LAB — I-STAT CHEM 8, ED
BUN: 21 mg/dL — ABNORMAL HIGH (ref 6–20)
CREATININE: 1.2 mg/dL (ref 0.61–1.24)
Calcium, Ion: 1.05 mmol/L — ABNORMAL LOW (ref 1.15–1.40)
Chloride: 100 mmol/L — ABNORMAL LOW (ref 101–111)
GLUCOSE: 438 mg/dL — AB (ref 65–99)
HCT: 44 % (ref 39.0–52.0)
Hemoglobin: 15 g/dL (ref 13.0–17.0)
Potassium: 3.3 mmol/L — ABNORMAL LOW (ref 3.5–5.1)
Sodium: 135 mmol/L (ref 135–145)
TCO2: 18 mmol/L — ABNORMAL LOW (ref 22–32)

## 2017-05-30 LAB — APTT
APTT: 25 s (ref 24–36)
APTT: 77 s — AB (ref 24–36)

## 2017-05-30 LAB — PROTIME-INR
INR: 1.08
INR: 2.42
Prothrombin Time: 13.9 seconds (ref 11.4–15.2)
Prothrombin Time: 26.1 seconds — ABNORMAL HIGH (ref 11.4–15.2)

## 2017-05-30 LAB — CBC
HCT: 36.2 % — ABNORMAL LOW (ref 39.0–52.0)
Hemoglobin: 11.6 g/dL — ABNORMAL LOW (ref 13.0–17.0)
MCH: 30 pg (ref 26.0–34.0)
MCHC: 32 g/dL (ref 30.0–36.0)
MCV: 93.5 fL (ref 78.0–100.0)
PLATELETS: 100 10*3/uL — AB (ref 150–400)
RBC: 3.87 MIL/uL — ABNORMAL LOW (ref 4.22–5.81)
RDW: 13.9 % (ref 11.5–15.5)
WBC: 13.5 10*3/uL — AB (ref 4.0–10.5)

## 2017-05-30 LAB — POCT I-STAT 4, (NA,K, GLUC, HGB,HCT)
Glucose, Bld: 315 mg/dL — ABNORMAL HIGH (ref 65–99)
HEMATOCRIT: 31 % — AB (ref 39.0–52.0)
HEMOGLOBIN: 10.5 g/dL — AB (ref 13.0–17.0)
Potassium: 3.4 mmol/L — ABNORMAL LOW (ref 3.5–5.1)
Sodium: 144 mmol/L (ref 135–145)

## 2017-05-30 LAB — LIPID PANEL
CHOL/HDL RATIO: 5.8 ratio
CHOLESTEROL: 214 mg/dL — AB (ref 0–200)
HDL: 37 mg/dL — AB (ref >40)
LDL Cholesterol: UNDETERMINED mg/dL (ref 0–99)
Triglycerides: 618 mg/dL — ABNORMAL HIGH (ref <150)
VLDL: UNDETERMINED mg/dL (ref 0–40)

## 2017-05-30 LAB — HEMOGLOBIN AND HEMATOCRIT, BLOOD
HCT: 28.1 % — ABNORMAL LOW (ref 39.0–52.0)
HEMOGLOBIN: 9.3 g/dL — AB (ref 13.0–17.0)

## 2017-05-30 LAB — POCT I-STAT 3, VENOUS BLOOD GAS (G3P V)
Acid-base deficit: 13 mmol/L — ABNORMAL HIGH (ref 0.0–2.0)
Bicarbonate: 17.5 mmol/L — ABNORMAL LOW (ref 20.0–28.0)
O2 Saturation: 38 %
TCO2: 19 mmol/L — AB (ref 22–32)
pCO2, Ven: 57.1 mmHg (ref 44.0–60.0)
pH, Ven: 7.094 — CL (ref 7.250–7.430)
pO2, Ven: 30 mmHg — CL (ref 32.0–45.0)

## 2017-05-30 LAB — PLATELET COUNT: Platelets: 114 10*3/uL — ABNORMAL LOW (ref 150–400)

## 2017-05-30 LAB — TROPONIN I: Troponin I: 4.13 ng/mL (ref <0.03)

## 2017-05-30 LAB — POCT I-STAT TROPONIN I: Troponin i, poc: 4.76 ng/mL (ref 0.00–0.08)

## 2017-05-30 LAB — ABO/RH: ABO/RH(D): A POS

## 2017-05-30 SURGERY — REPLACEMENT, MITRAL VALVE
Anesthesia: General | Site: Chest

## 2017-05-30 SURGERY — LEFT HEART CATH AND CORONARY ANGIOGRAPHY
Anesthesia: LOCAL

## 2017-05-30 MED ORDER — ASPIRIN 81 MG PO CHEW
324.0000 mg | CHEWABLE_TABLET | Freq: Once | ORAL | Status: AC
  Administered 2017-05-30: 324 mg via ORAL

## 2017-05-30 MED ORDER — MILRINONE LACTATE IN DEXTROSE 20-5 MG/100ML-% IV SOLN
0.1250 ug/kg/min | INTRAVENOUS | Status: DC
Start: 2017-05-30 — End: 2017-05-30
  Filled 2017-05-30: qty 100

## 2017-05-30 MED ORDER — INSULIN REGULAR HUMAN 100 UNIT/ML IJ SOLN
INTRAMUSCULAR | Status: AC
  Administered 2017-05-30: 9.4 [IU]/h via INTRAVENOUS
  Filled 2017-05-30: qty 1

## 2017-05-30 MED ORDER — METOPROLOL TARTRATE 5 MG/5ML IV SOLN: 2.5000 mg | INTRAVENOUS | Status: DC | PRN

## 2017-05-30 MED ORDER — MILRINONE LACTATE IN DEXTROSE 20-5 MG/100ML-% IV SOLN
0.2500 ug/kg/min | INTRAVENOUS | Status: DC
  Administered 2017-05-30: .3 ug/kg/min via INTRAVENOUS
  Filled 2017-05-30: qty 100

## 2017-05-30 MED ORDER — SODIUM CHLORIDE 0.9 % IV SOLN
2.0000 mg/h | INTRAVENOUS | Status: DC
  Administered 2017-05-30: 4 mg/h via INTRAVENOUS
  Filled 2017-05-30: qty 10

## 2017-05-30 MED ORDER — TRANEXAMIC ACID 1000 MG/10ML IV SOLN
1.5000 mg/kg/h | INTRAVENOUS | Status: DC
  Filled 2017-05-30: qty 25

## 2017-05-30 MED ORDER — THROMBIN (RECOMBINANT) 20000 UNITS EX SOLR
OROMUCOSAL | Status: DC | PRN
  Administered 2017-05-30 (×3): 4 mL via TOPICAL

## 2017-05-30 MED ORDER — 0.9 % SODIUM CHLORIDE (POUR BTL) OPTIME
TOPICAL | Status: DC | PRN
  Administered 2017-05-30: 5000 mL

## 2017-05-30 MED ORDER — AMIODARONE HCL IN DEXTROSE 360-4.14 MG/200ML-% IV SOLN
INTRAVENOUS | Status: DC | PRN
  Administered 2017-05-30: 60 mg/h via INTRAVENOUS

## 2017-05-30 MED ORDER — HEPARIN BOLUS VIA INFUSION
4000.0000 [IU] | Freq: Once | INTRAVENOUS | Status: AC
  Administered 2017-05-30: 4000 [IU] via INTRAVENOUS
  Filled 2017-05-30: qty 4000

## 2017-05-30 MED ORDER — MAGNESIUM SULFATE 50 % IJ SOLN
INTRAMUSCULAR | Status: AC
  Filled 2017-05-30: qty 2

## 2017-05-30 MED ORDER — METOPROLOL TARTRATE 12.5 MG HALF TABLET: 12.5000 mg | ORAL_TABLET | Freq: Two times a day (BID) | ORAL | Status: DC

## 2017-05-30 MED ORDER — LACTATED RINGERS IV SOLN
INTRAVENOUS | Status: DC | PRN
  Administered 2017-05-30: 16:00:00 via INTRAVENOUS

## 2017-05-30 MED ORDER — ASPIRIN EC 325 MG PO TBEC: 325.0000 mg | DELAYED_RELEASE_TABLET | Freq: Every day | ORAL | Status: DC

## 2017-05-30 MED ORDER — HEPARIN SODIUM (PORCINE) 1000 UNIT/ML IJ SOLN
INTRAMUSCULAR | Status: DC | PRN
  Administered 2017-05-30: 36000 [IU] via INTRAVENOUS

## 2017-05-30 MED ORDER — MORPHINE SULFATE (PF) 2 MG/ML IV SOLN: 1.0000 mg | INTRAVENOUS | Status: DC | PRN

## 2017-05-30 MED ORDER — TRANEXAMIC ACID (OHS) PUMP PRIME SOLUTION
2.0000 mg/kg | INTRAVENOUS | Status: DC
  Filled 2017-05-30: qty 2.04

## 2017-05-30 MED ORDER — ROCURONIUM BROMIDE 100 MG/10ML IV SOLN
INTRAVENOUS | Status: DC | PRN
  Administered 2017-05-30 (×3): 50 mg via INTRAVENOUS

## 2017-05-30 MED ORDER — LACTATED RINGERS IV SOLN
500.0000 mL | Freq: Once | INTRAVENOUS | Status: DC | PRN
Start: 2017-05-30 — End: 2017-05-31

## 2017-05-30 MED ORDER — IOPAMIDOL (ISOVUE-370) INJECTION 76%
INTRAVENOUS | Status: AC
  Filled 2017-05-30: qty 100

## 2017-05-30 MED ORDER — LIDOCAINE HCL 1 % IJ SOLN
INTRAMUSCULAR | Status: AC
  Filled 2017-05-30: qty 60

## 2017-05-30 MED ORDER — ASPIRIN 81 MG PO CHEW: 324.0000 mg | CHEWABLE_TABLET | Freq: Every day | ORAL | Status: DC

## 2017-05-30 MED ORDER — EPINEPHRINE PF 1 MG/ML IJ SOLN
0.5000 ug/min | INTRAMUSCULAR | Status: DC
Start: 2017-05-30 — End: 2017-05-30
  Administered 2017-05-30: 3 ug/min via INTRAVENOUS
  Filled 2017-05-30 (×2): qty 4

## 2017-05-30 MED ORDER — IOPAMIDOL (ISOVUE-370) INJECTION 76%
INTRAVENOUS | Status: DC | PRN
  Administered 2017-05-30: 150 mL via INTRA_ARTERIAL

## 2017-05-30 MED ORDER — INSULIN REGULAR BOLUS VIA INFUSION
0.0000 [IU] | Freq: Three times a day (TID) | INTRAVENOUS | Status: DC
  Filled 2017-05-30: qty 10

## 2017-05-30 MED ORDER — PROTAMINE SULFATE 10 MG/ML IV SOLN
INTRAVENOUS | Status: DC | PRN
  Administered 2017-05-30: 360 mg via INTRAVENOUS

## 2017-05-30 MED ORDER — OXYCODONE HCL 5 MG PO TABS: 5.0000 mg | ORAL_TABLET | ORAL | Status: DC | PRN

## 2017-05-30 MED ORDER — THROMBIN 20000 UNITS EX SOLR
CUTANEOUS | Status: AC
  Filled 2017-05-30: qty 20000

## 2017-05-30 MED ORDER — SODIUM BICARBONATE 8.4 % IV SOLN
INTRAVENOUS | Status: DC | PRN
  Administered 2017-05-30: 100 meq via INTRAVENOUS
  Administered 2017-05-30 (×2): 50 meq via INTRAVENOUS

## 2017-05-30 MED ORDER — ACETAMINOPHEN 500 MG PO TABS: 1000.0000 mg | ORAL_TABLET | Freq: Four times a day (QID) | ORAL | Status: DC

## 2017-05-30 MED ORDER — NITROGLYCERIN IN D5W 200-5 MCG/ML-% IV SOLN
2.0000 ug/min | INTRAVENOUS | Status: DC
  Filled 2017-05-30: qty 250

## 2017-05-30 MED ORDER — SODIUM CHLORIDE 0.9 % IV SOLN
0.5000 mg/h | INTRAVENOUS | Status: DC
  Filled 2017-05-30: qty 10

## 2017-05-30 MED ORDER — SODIUM CHLORIDE 0.9 % IV SOLN: INTRAVENOUS | Status: DC

## 2017-05-30 MED ORDER — SODIUM BICARBONATE 8.4 % IV SOLN
INTRAVENOUS | Status: AC
  Filled 2017-05-30: qty 50

## 2017-05-30 MED ORDER — BISACODYL 10 MG RE SUPP: 10.0000 mg | Freq: Every day | RECTAL | Status: DC

## 2017-05-30 MED ORDER — EPINEPHRINE PF 1 MG/ML IJ SOLN
INTRAMUSCULAR | Status: AC
  Filled 2017-05-30: qty 1

## 2017-05-30 MED ORDER — ALBUMIN HUMAN 5 % IV SOLN
INTRAVENOUS | Status: DC | PRN
Start: 2017-05-30 — End: 2017-05-30
  Administered 2017-05-30: 22:00:00 via INTRAVENOUS

## 2017-05-30 MED ORDER — SODIUM BICARBONATE 8.4 % IV SOLN
INTRAVENOUS | Status: AC
  Filled 2017-05-30: qty 100

## 2017-05-30 MED ORDER — METOPROLOL TARTRATE 25 MG/10 ML ORAL SUSPENSION: 12.5000 mg | Freq: Two times a day (BID) | ORAL | Status: DC

## 2017-05-30 MED ORDER — KENNESTONE BLOOD CARDIOPLEGIA (KBC) MANNITOL SYRINGE (20%, 32ML)
32.0000 mL | Freq: Once | INTRAVENOUS | Status: DC
  Filled 2017-05-30: qty 32

## 2017-05-30 MED ORDER — FENTANYL CITRATE (PF) 100 MCG/2ML IJ SOLN
INTRAMUSCULAR | Status: DC | PRN
  Administered 2017-05-30: 50 ug via INTRAVENOUS

## 2017-05-30 MED ORDER — ARTIFICIAL TEARS OPHTHALMIC OINT
TOPICAL_OINTMENT | OPHTHALMIC | Status: DC | PRN
  Administered 2017-05-30: 1 via OPHTHALMIC

## 2017-05-30 MED ORDER — HEMOSTATIC AGENTS (NO CHARGE) OPTIME
TOPICAL | Status: DC | PRN
  Administered 2017-05-30: 1 via TOPICAL

## 2017-05-30 MED ORDER — NOREPINEPHRINE BITARTRATE 1 MG/ML IV SOLN
0.0000 ug/min | INTRAVENOUS | Status: DC
  Administered 2017-05-30: 30 ug/min via INTRAVENOUS
  Filled 2017-05-30 (×3): qty 16

## 2017-05-30 MED ORDER — TRANEXAMIC ACID 1000 MG/10ML IV SOLN
1.5000 mg/kg/h | INTRAVENOUS | Status: AC
  Administered 2017-05-30: 1.5 mg/kg/h via INTRAVENOUS
  Filled 2017-05-30: qty 25

## 2017-05-30 MED ORDER — DEXMEDETOMIDINE HCL IN NACL 400 MCG/100ML IV SOLN
0.1000 ug/kg/h | INTRAVENOUS | Status: AC
  Administered 2017-05-30: 0.7 ug/kg/h via INTRAVENOUS
  Filled 2017-05-30: qty 100

## 2017-05-30 MED ORDER — FUROSEMIDE 10 MG/ML IJ SOLN
INTRAMUSCULAR | Status: AC
  Filled 2017-05-30: qty 8

## 2017-05-30 MED ORDER — HEPARIN (PORCINE) IN NACL 2-0.9 UNIT/ML-% IJ SOLN
INTRAMUSCULAR | Status: AC | PRN
  Administered 2017-05-30 (×2): 500 mL via INTRA_ARTERIAL

## 2017-05-30 MED ORDER — NOREPINEPHRINE 4 MG/250ML-% IV SOLN
INTRAVENOUS | Status: AC
  Filled 2017-05-30: qty 250

## 2017-05-30 MED ORDER — POTASSIUM CHLORIDE 10 MEQ/50ML IV SOLN: 10.0000 meq | INTRAVENOUS | Status: AC

## 2017-05-30 MED ORDER — SODIUM CHLORIDE 0.9 % IV SOLN: INTRAVENOUS | Status: DC | PRN

## 2017-05-30 MED ORDER — ACETAMINOPHEN 160 MG/5ML PO SOLN: 650.0000 mg | Freq: Once | ORAL | Status: AC

## 2017-05-30 MED ORDER — FAMOTIDINE IN NACL 20-0.9 MG/50ML-% IV SOLN
20.0000 mg | Freq: Two times a day (BID) | INTRAVENOUS | Status: DC
  Administered 2017-05-30: 20 mg via INTRAVENOUS
  Filled 2017-05-30 (×2): qty 50

## 2017-05-30 MED ORDER — SODIUM CHLORIDE 0.9% FLUSH: 3.0000 mL | Freq: Two times a day (BID) | INTRAVENOUS | Status: DC

## 2017-05-30 MED ORDER — DEXMEDETOMIDINE HCL IN NACL 400 MCG/100ML IV SOLN
0.1000 ug/kg/h | INTRAVENOUS | Status: DC
  Administered 2017-05-31: 0.5 ug/kg/h via INTRAVENOUS
  Filled 2017-05-30 (×3): qty 100

## 2017-05-30 MED ORDER — LACTATED RINGERS IV SOLN
INTRAVENOUS | Status: DC | PRN
  Administered 2017-05-30 (×2): via INTRAVENOUS

## 2017-05-30 MED ORDER — SUCCINYLCHOLINE CHLORIDE 20 MG/ML IJ SOLN
INTRAMUSCULAR | Status: DC | PRN
  Administered 2017-05-30: 100 mg via INTRAVENOUS

## 2017-05-30 MED ORDER — HEPARIN (PORCINE) IN NACL 100-0.45 UNIT/ML-% IJ SOLN
1350.0000 [IU]/h | INTRAMUSCULAR | Status: DC
  Filled 2017-05-30 (×2): qty 250

## 2017-05-30 MED ORDER — PANTOPRAZOLE SODIUM 40 MG PO TBEC: 40.0000 mg | DELAYED_RELEASE_TABLET | Freq: Every day | ORAL | Status: DC

## 2017-05-30 MED ORDER — VASOPRESSIN 20 UNIT/ML IV SOLN
0.0300 [IU]/min | INTRAVENOUS | Status: DC
  Administered 2017-05-30: 0.03 [IU]/min via INTRAVENOUS
  Filled 2017-05-30: qty 2

## 2017-05-30 MED ORDER — ALBUMIN HUMAN 5 % IV SOLN
250.0000 mL | INTRAVENOUS | Status: DC | PRN
  Administered 2017-05-30: 250 mL via INTRAVENOUS
  Filled 2017-05-30 (×2): qty 250

## 2017-05-30 MED ORDER — TRANEXAMIC ACID (OHS) BOLUS VIA INFUSION
15.0000 mg/kg | INTRAVENOUS | Status: AC
  Administered 2017-05-30: 1530 mg via INTRAVENOUS
  Filled 2017-05-30: qty 1530

## 2017-05-30 MED ORDER — MIDAZOLAM HCL 2 MG/2ML IJ SOLN: 2.0000 mg | INTRAMUSCULAR | Status: DC | PRN

## 2017-05-30 MED ORDER — MAGNESIUM SULFATE 50 % IJ SOLN
INTRAMUSCULAR | Status: DC | PRN
  Administered 2017-05-30: 2 g via INTRAVENOUS

## 2017-05-30 MED ORDER — ONDANSETRON HCL 4 MG/2ML IJ SOLN: 4.0000 mg | Freq: Four times a day (QID) | INTRAMUSCULAR | Status: DC | PRN

## 2017-05-30 MED ORDER — AMIODARONE HCL 150 MG/3ML IV SOLN
INTRAVENOUS | Status: DC | PRN
  Administered 2017-05-30: 150 mg via INTRAVENOUS
  Administered 2017-05-30: 300 mg via INTRAVENOUS

## 2017-05-30 MED ORDER — SODIUM CHLORIDE 0.9 % IV SOLN
0.0000 ug/kg/h | INTRAVENOUS | Status: DC
  Filled 2017-05-30: qty 4

## 2017-05-30 MED ORDER — FUROSEMIDE 10 MG/ML IJ SOLN
INTRAMUSCULAR | Status: AC
  Filled 2017-05-30: qty 4

## 2017-05-30 MED ORDER — CHLORHEXIDINE GLUCONATE 0.12 % MT SOLN
15.0000 mL | OROMUCOSAL | Status: AC
  Administered 2017-05-31: 15 mL via OROMUCOSAL

## 2017-05-30 MED ORDER — AMIODARONE HCL 150 MG/3ML IV SOLN
INTRAVENOUS | Status: AC
  Filled 2017-05-30: qty 6

## 2017-05-30 MED ORDER — SODIUM BICARBONATE 8.4 % IV SOLN
100.0000 meq | Freq: Once | INTRAVENOUS | Status: AC
  Administered 2017-05-30: 100 meq via INTRAVENOUS

## 2017-05-30 MED ORDER — ARTIFICIAL TEARS OPHTHALMIC OINT
1.0000 "application " | TOPICAL_OINTMENT | Freq: Three times a day (TID) | OPHTHALMIC | Status: DC
  Filled 2017-05-30: qty 3.5

## 2017-05-30 MED ORDER — CALCIUM CHLORIDE 10 % IV SOLN
INTRAVENOUS | Status: DC | PRN
  Administered 2017-05-30 (×2): 1 g via INTRAVENOUS

## 2017-05-30 MED ORDER — ROCURONIUM BROMIDE 50 MG/5ML IV SOLN
INTRAVENOUS | Status: AC
  Filled 2017-05-30: qty 5

## 2017-05-30 MED ORDER — TRAMADOL HCL 50 MG PO TABS: 50.0000 mg | ORAL_TABLET | ORAL | Status: DC | PRN

## 2017-05-30 MED ORDER — NOREPINEPHRINE 4 MG/250ML-% IV SOLN: 0.0000 ug/min | INTRAVENOUS | Status: DC

## 2017-05-30 MED ORDER — VANCOMYCIN HCL IN DEXTROSE 1-5 GM/200ML-% IV SOLN
1000.0000 mg | Freq: Once | INTRAVENOUS | Status: DC
  Filled 2017-05-30: qty 200

## 2017-05-30 MED ORDER — FENTANYL 2500MCG IN NS 250ML (10MCG/ML) PREMIX INFUSION
0.0000 ug/h | INTRAVENOUS | Status: DC
  Administered 2017-05-30: 150 ug/h via INTRAVENOUS
  Filled 2017-05-30: qty 250

## 2017-05-30 MED ORDER — SODIUM CHLORIDE 0.9% FLUSH: 3.0000 mL | INTRAVENOUS | Status: DC | PRN

## 2017-05-30 MED ORDER — ETOMIDATE 2 MG/ML IV SOLN
INTRAVENOUS | Status: DC | PRN
  Administered 2017-05-30: 10 mg via INTRAVENOUS

## 2017-05-30 MED ORDER — SODIUM CHLORIDE 0.9 % IV SOLN
INTRAVENOUS | Status: DC
  Filled 2017-05-30: qty 30

## 2017-05-30 MED ORDER — VANCOMYCIN HCL 1000 MG IV SOLR
INTRAVENOUS | Status: DC
  Filled 2017-05-30: qty 1000

## 2017-05-30 MED ORDER — MAGNESIUM SULFATE 50 % IJ SOLN
40.0000 meq | INTRAMUSCULAR | Status: DC
Start: 2017-05-30 — End: 2017-05-30
  Filled 2017-05-30: qty 9.85

## 2017-05-30 MED ORDER — LACTATED RINGERS IV SOLN: INTRAVENOUS | Status: DC

## 2017-05-30 MED ORDER — PHENYLEPHRINE HCL 10 MG/ML IJ SOLN
INTRAMUSCULAR | Status: DC | PRN
  Administered 2017-05-30: 160 ug via INTRAVENOUS

## 2017-05-30 MED ORDER — VANCOMYCIN HCL 10 G IV SOLR
1500.0000 mg | INTRAVENOUS | Status: AC
  Administered 2017-05-30: 1500 mg via INTRAVENOUS
  Filled 2017-05-30: qty 1500

## 2017-05-30 MED ORDER — NOREPINEPHRINE BITARTRATE 1 MG/ML IV SOLN
0.0000 ug/min | INTRAVENOUS | Status: DC
  Administered 2017-05-31: 30 ug/min via INTRAVENOUS
  Filled 2017-05-30: qty 16

## 2017-05-30 MED ORDER — ROCURONIUM BROMIDE 50 MG/5ML IV SOLN
INTRAVENOUS | Status: DC | PRN
  Administered 2017-05-30: 60 mg via INTRAVENOUS

## 2017-05-30 MED ORDER — AMIODARONE HCL IN DEXTROSE 360-4.14 MG/200ML-% IV SOLN
30.0000 mg/h | INTRAVENOUS | Status: DC
  Administered 2017-05-31: 30 mg/h via INTRAVENOUS

## 2017-05-30 MED ORDER — FENTANYL CITRATE (PF) 100 MCG/2ML IJ SOLN
INTRAMUSCULAR | Status: AC
  Filled 2017-05-30: qty 2

## 2017-05-30 MED ORDER — MIDAZOLAM HCL 10 MG/2ML IJ SOLN
INTRAMUSCULAR | Status: AC
  Filled 2017-05-30: qty 2

## 2017-05-30 MED ORDER — CEFAZOLIN SODIUM-DEXTROSE 2-4 GM/100ML-% IV SOLN
2.0000 g | Freq: Three times a day (TID) | INTRAVENOUS | Status: DC
  Administered 2017-05-31: 2 g via INTRAVENOUS
  Filled 2017-05-30 (×2): qty 100

## 2017-05-30 MED ORDER — SODIUM CHLORIDE 0.9 % IV SOLN
INTRAVENOUS | Status: AC | PRN
  Administered 2017-05-30: 4 mg/h
  Administered 2017-05-30: 2 mg/h
  Administered 2017-05-30: 1 mg/h via INTRAVENOUS
  Administered 2017-05-30 (×2): 4 mg/h

## 2017-05-30 MED ORDER — DOCUSATE SODIUM 100 MG PO CAPS: 200.0000 mg | ORAL_CAPSULE | Freq: Every day | ORAL | Status: DC

## 2017-05-30 MED ORDER — VASOPRESSIN 20 UNIT/ML IV SOLN
0.0300 [IU]/min | INTRAVENOUS | Status: DC
  Administered 2017-05-31: 0.03 [IU]/min via INTRAVENOUS
  Filled 2017-05-30: qty 2

## 2017-05-30 MED ORDER — EPINEPHRINE PF 1 MG/ML IJ SOLN
0.0000 ug/min | INTRAVENOUS | Status: DC
  Filled 2017-05-30: qty 4

## 2017-05-30 MED ORDER — SODIUM CHLORIDE 0.9 % IV SOLN
0.0000 ug/min | INTRAVENOUS | Status: DC
  Administered 2017-05-31: 20 ug/min via INTRAVENOUS
  Filled 2017-05-30: qty 2

## 2017-05-30 MED ORDER — SODIUM CHLORIDE 0.9 % IV SOLN
0.0000 ug/h | INTRAVENOUS | Status: DC
  Administered 2017-05-30: 75 ug/h via INTRAVENOUS
  Administered 2017-05-30 (×2): 50 ug/h via INTRAVENOUS
  Filled 2017-05-30: qty 50

## 2017-05-30 MED ORDER — SODIUM CHLORIDE 0.9 % IV SOLN
INTRAVENOUS | Status: DC
  Filled 2017-05-30: qty 1

## 2017-05-30 MED ORDER — NITROGLYCERIN IN D5W 200-5 MCG/ML-% IV SOLN: 0.0000 ug/min | INTRAVENOUS | Status: DC

## 2017-05-30 MED ORDER — MIDAZOLAM HCL 2 MG/2ML IJ SOLN
INTRAMUSCULAR | Status: DC | PRN
  Administered 2017-05-30: 2 mg via INTRAVENOUS

## 2017-05-30 MED ORDER — BISACODYL 5 MG PO TBEC: 10.0000 mg | DELAYED_RELEASE_TABLET | Freq: Every day | ORAL | Status: DC

## 2017-05-30 MED ORDER — SODIUM CHLORIDE 0.9 % IV SOLN: 1.5000 g | Freq: Two times a day (BID) | INTRAVENOUS | Status: DC

## 2017-05-30 MED ORDER — SODIUM CHLORIDE 0.9 % IV SOLN
INTRAVENOUS | Status: DC
  Administered 2017-05-31: 15.3 [IU]/h via INTRAVENOUS
  Filled 2017-05-30: qty 1

## 2017-05-30 MED ORDER — FENTANYL CITRATE (PF) 250 MCG/5ML IJ SOLN
INTRAMUSCULAR | Status: AC
  Filled 2017-05-30: qty 20

## 2017-05-30 MED ORDER — MILRINONE LACTATE IN DEXTROSE 20-5 MG/100ML-% IV SOLN
0.3000 ug/kg/min | INTRAVENOUS | Status: DC
  Administered 2017-05-31: 0.3 ug/kg/min via INTRAVENOUS

## 2017-05-30 MED ORDER — SODIUM CHLORIDE 0.9 % IV SOLN
3.0000 ug/kg/min | INTRAVENOUS | Status: DC
  Administered 2017-05-30: 3 ug/kg/min via INTRAVENOUS
  Filled 2017-05-30 (×2): qty 20

## 2017-05-30 MED ORDER — EPINEPHRINE PF 1 MG/ML IJ SOLN
0.0000 ug/min | INTRAVENOUS | Status: DC
  Administered 2017-05-31: 7 ug/min via INTRAVENOUS
  Filled 2017-05-30: qty 4

## 2017-05-30 MED ORDER — AMIODARONE HCL 150 MG/3ML IV SOLN
INTRAVENOUS | Status: AC
  Filled 2017-05-30: qty 3

## 2017-05-30 MED ORDER — SODIUM CHLORIDE 0.9 % IV SOLN
INTRAVENOUS | Status: DC | PRN
  Administered 2017-05-30: 16:00:00 via INTRAVENOUS

## 2017-05-30 MED ORDER — POTASSIUM CHLORIDE 2 MEQ/ML IV SOLN
80.0000 meq | INTRAVENOUS | Status: DC
  Filled 2017-05-30: qty 40

## 2017-05-30 MED ORDER — MAGNESIUM SULFATE 4 GM/100ML IV SOLN
4.0000 g | Freq: Once | INTRAVENOUS | Status: AC
  Administered 2017-05-30: 4 g via INTRAVENOUS
  Filled 2017-05-30: qty 100

## 2017-05-30 MED ORDER — LIDOCAINE HCL (CARDIAC) 20 MG/ML IV SOLN
INTRAVENOUS | Status: DC | PRN
Start: 2017-05-30 — End: 2017-05-30
  Administered 2017-05-30: 100 mg via INTRATRACHEAL

## 2017-05-30 MED ORDER — AMIODARONE HCL IN DEXTROSE 360-4.14 MG/200ML-% IV SOLN
30.0000 mg/h | INTRAVENOUS | Status: DC
  Filled 2017-05-30: qty 200

## 2017-05-30 MED ORDER — SODIUM CHLORIDE 0.9 % IV SOLN
1.5000 g | INTRAVENOUS | Status: AC
  Administered 2017-05-30: .75 g via INTRAVENOUS
  Administered 2017-05-30: 1.5 g via INTRAVENOUS
  Filled 2017-05-30: qty 1.5

## 2017-05-30 MED ORDER — ARTIFICIAL TEARS OPHTHALMIC OINT
TOPICAL_OINTMENT | OPHTHALMIC | Status: AC
  Filled 2017-05-30: qty 3.5

## 2017-05-30 MED ORDER — SODIUM BICARBONATE 8.4 % IV SOLN
INTRAVENOUS | Status: DC | PRN
  Administered 2017-05-30: 50 meq via INTRAVENOUS

## 2017-05-30 MED ORDER — NOREPINEPHRINE BITARTRATE 1 MG/ML IV SOLN
INTRAVENOUS | Status: AC | PRN
  Administered 2017-05-30: 20 ug/kg/min via INTRAVENOUS

## 2017-05-30 MED ORDER — ACETAMINOPHEN 650 MG RE SUPP
650.0000 mg | Freq: Once | RECTAL | Status: AC
  Administered 2017-05-30: 650 mg via RECTAL

## 2017-05-30 MED ORDER — NOREPINEPHRINE BITARTRATE 1 MG/ML IV SOLN
0.0000 ug/min | INTRAVENOUS | Status: DC
  Administered 2017-05-30: 30 ug/min via INTRAVENOUS
  Filled 2017-05-30: qty 4

## 2017-05-30 MED ORDER — DOPAMINE-DEXTROSE 3.2-5 MG/ML-% IV SOLN: 0.0000 ug/kg/min | INTRAVENOUS | Status: DC

## 2017-05-30 MED ORDER — PLASMA-LYTE 148 IV SOLN
INTRAVENOUS | Status: AC
  Administered 2017-05-30: 500 mL
  Filled 2017-05-30: qty 2.5

## 2017-05-30 MED ORDER — SODIUM CHLORIDE 0.9 % IV SOLN
750.0000 mg | INTRAVENOUS | Status: DC
  Filled 2017-05-30: qty 750

## 2017-05-30 MED ORDER — SODIUM CHLORIDE 0.9 % IV SOLN
30.0000 ug/min | INTRAVENOUS | Status: AC
  Administered 2017-05-30: 20 ug/min via INTRAVENOUS
  Filled 2017-05-30 (×2): qty 2

## 2017-05-30 MED ORDER — ACETAMINOPHEN 160 MG/5ML PO SOLN: 1000.0000 mg | Freq: Four times a day (QID) | ORAL | Status: DC

## 2017-05-30 MED ORDER — MIDAZOLAM HCL 2 MG/2ML IJ SOLN
INTRAMUSCULAR | Status: AC
  Filled 2017-05-30: qty 2

## 2017-05-30 MED ORDER — DOPAMINE-DEXTROSE 3.2-5 MG/ML-% IV SOLN
0.0000 ug/kg/min | INTRAVENOUS | Status: DC
  Filled 2017-05-30: qty 250

## 2017-05-30 MED ORDER — KENNESTONE BLOOD CARDIOPLEGIA VIAL
13.0000 mL | Freq: Once | Status: DC
  Filled 2017-05-30: qty 13

## 2017-05-30 MED ORDER — MORPHINE SULFATE (PF) 2 MG/ML IV SOLN: 2.0000 mg | INTRAVENOUS | Status: DC | PRN

## 2017-05-30 MED ORDER — ROSUVASTATIN CALCIUM 40 MG PO TABS: 40.0000 mg | ORAL_TABLET | Freq: Every day | ORAL | Status: DC

## 2017-05-30 MED ORDER — SODIUM CHLORIDE 0.45 % IV SOLN: INTRAVENOUS | Status: DC | PRN

## 2017-05-30 MED ORDER — FUROSEMIDE 10 MG/ML IJ SOLN
INTRAMUSCULAR | Status: DC | PRN
  Administered 2017-05-30 (×2): 80 mg via INTRAVENOUS

## 2017-05-30 MED ORDER — SODIUM CHLORIDE 0.9 % IV SOLN: 250.0000 mL | INTRAVENOUS | Status: DC

## 2017-05-30 MED ORDER — THROMBIN 20000 UNITS EX KIT
PACK | CUTANEOUS | Status: DC | PRN
Start: 2017-05-30 — End: 2017-05-30
  Administered 2017-05-30: 20000 [IU] via TOPICAL

## 2017-05-30 MED ORDER — STERILE WATER FOR INJECTION IV SOLN
INTRAVENOUS | Status: DC
  Administered 2017-05-30: 14:00:00 via INTRAVENOUS
  Filled 2017-05-30 (×3): qty 850

## 2017-05-30 MED ORDER — HEPARIN SODIUM (PORCINE) 1000 UNIT/ML IJ SOLN
INTRAMUSCULAR | Status: DC | PRN
  Administered 2017-05-30: 4000 [IU] via INTRAVENOUS

## 2017-05-30 SURGICAL SUPPLY — 86 items
ADAPTER CARDIO PERF ANTE/RETRO (ADAPTER) ×8 IMPLANT
BAG DECANTER FOR FLEXI CONT (MISCELLANEOUS) ×4 IMPLANT
BANDAGE ESMARK 6X9 LF (GAUZE/BANDAGES/DRESSINGS) ×2 IMPLANT
BLADE STERNUM SYSTEM 6 (BLADE) ×4 IMPLANT
BLADE SURG 11 STRL SS (BLADE) ×4 IMPLANT
BLADE SURG 15 STRL LF DISP TIS (BLADE) ×2 IMPLANT
BLADE SURG 15 STRL SS (BLADE) ×2
BNDG ESMARK 6X9 LF (GAUZE/BANDAGES/DRESSINGS) ×4
CANISTER SUCT 3000ML PPV (MISCELLANEOUS) ×4 IMPLANT
CANN PRFSN 3/8X14X24FR PCFC (MISCELLANEOUS) ×2
CANNULA ARTERIAL NVNT 3/8 22FR (MISCELLANEOUS) ×4 IMPLANT
CANNULA GUNDRY RCSP 15FR (MISCELLANEOUS) ×8 IMPLANT
CANNULA PRFSN 3/8X14X24FR PCFC (MISCELLANEOUS) ×2 IMPLANT
CANNULA SUMP PERICARDIAL (CANNULA) ×4 IMPLANT
CANNULA VEN MTL TIP RT (MISCELLANEOUS) ×2
CANNULA VRC MALB SNGL STG 34FR (MISCELLANEOUS) ×2 IMPLANT
CATH ROBINSON RED A/P 18FR (CATHETERS) ×16 IMPLANT
CATH THORACIC 36FR (CATHETERS) ×4 IMPLANT
CATH THORACIC 36FR RT ANG (CATHETERS) ×4 IMPLANT
CLIP VESOCCLUDE SM WIDE 24/CT (CLIP) ×12 IMPLANT
CONN 1/2X1/2X1/2  BEN (MISCELLANEOUS) ×4
CONN 1/2X1/2X1/2 BEN (MISCELLANEOUS) ×4 IMPLANT
CONN 3/8X1/2 ST GISH (MISCELLANEOUS) ×8 IMPLANT
CONT SPEC 4OZ CLIKSEAL STRL BL (MISCELLANEOUS) ×4 IMPLANT
COVER SURGICAL LIGHT HANDLE (MISCELLANEOUS) ×8 IMPLANT
CRADLE DONUT ADULT HEAD (MISCELLANEOUS) ×4 IMPLANT
DRAPE CARDIOVASCULAR INCISE (DRAPES) ×2
DRAPE SLUSH/WARMER DISC (DRAPES) IMPLANT
DRAPE SRG 135X102X78XABS (DRAPES) ×2 IMPLANT
DRSG COVADERM 4X14 (GAUZE/BANDAGES/DRESSINGS) ×4 IMPLANT
DRSG VAC ATS LRG SENSATRAC (GAUZE/BANDAGES/DRESSINGS) ×4 IMPLANT
ELECT CAUTERY BLADE 6.4 (BLADE) ×4 IMPLANT
ELECT REM PT RETURN 9FT ADLT (ELECTROSURGICAL) ×8
ELECTRODE REM PT RTRN 9FT ADLT (ELECTROSURGICAL) ×4 IMPLANT
FELT TEFLON 1X6 (MISCELLANEOUS) ×8 IMPLANT
GAUZE SPONGE 4X4 12PLY STRL (GAUZE/BANDAGES/DRESSINGS) ×4 IMPLANT
GLOVE EUDERMIC 7 POWDERFREE (GLOVE) ×24 IMPLANT
GOWN STRL REUS W/ TWL LRG LVL3 (GOWN DISPOSABLE) ×8 IMPLANT
GOWN STRL REUS W/ TWL XL LVL3 (GOWN DISPOSABLE) ×2 IMPLANT
GOWN STRL REUS W/TWL LRG LVL3 (GOWN DISPOSABLE) ×8
GOWN STRL REUS W/TWL XL LVL3 (GOWN DISPOSABLE) ×2
HEMOSTAT POWDER SURGIFOAM 1G (HEMOSTASIS) ×12 IMPLANT
HEMOSTAT SURGICEL 2X14 (HEMOSTASIS) ×8 IMPLANT
KIT BASIN OR (CUSTOM PROCEDURE TRAY) ×4 IMPLANT
KIT CATH CPB BARTLE (MISCELLANEOUS) ×4 IMPLANT
KIT ROOM TURNOVER OR (KITS) ×4 IMPLANT
KIT SUCTION CATH 14FR (SUCTIONS) ×4 IMPLANT
LINE VENT (MISCELLANEOUS) ×4 IMPLANT
LOOP VESSEL SUPERMAXI WHITE (MISCELLANEOUS) ×8 IMPLANT
NS IRRIG 1000ML POUR BTL (IV SOLUTION) ×20 IMPLANT
PACK OPEN HEART (CUSTOM PROCEDURE TRAY) ×4 IMPLANT
PAD ARMBOARD 7.5X6 YLW CONV (MISCELLANEOUS) ×8 IMPLANT
SET CARDIOPLEGIA MPS 5001102 (MISCELLANEOUS) ×4 IMPLANT
SUCKER WEIGHTED FLEX (MISCELLANEOUS) ×4 IMPLANT
SUT BONE WAX W31G (SUTURE) ×4 IMPLANT
SUT ETHIBON 2 0 V 52N 30 (SUTURE) ×8 IMPLANT
SUT ETHIBOND 2 0 SH (SUTURE) ×4 IMPLANT
SUT ETHIBOND 2 0 SH 36X2 (SUTURE) ×4 IMPLANT
SUT ETHIBOND 2 0 V4 (SUTURE) IMPLANT
SUT ETHIBOND 2 0V4 GREEN (SUTURE) IMPLANT
SUT PROLENE 3 0 SH 48 (SUTURE) ×16 IMPLANT
SUT PROLENE 3 0 SH DA (SUTURE) ×12 IMPLANT
SUT PROLENE 3 0 SH1 36 (SUTURE) ×4 IMPLANT
SUT PROLENE 4 0 RB 1 (SUTURE) ×6
SUT PROLENE 4-0 RB1 .5 CRCL 36 (SUTURE) ×6 IMPLANT
SUT SILK  1 MH (SUTURE) ×4
SUT SILK 1 MH (SUTURE) ×4 IMPLANT
SUT SILK 2 0 (SUTURE) ×2
SUT SILK 2 0 SH CR/8 (SUTURE) ×4 IMPLANT
SUT SILK 2-0 18XBRD TIE 12 (SUTURE) ×2 IMPLANT
SUT SILK 3 0 TIES 10X30 (SUTURE) ×12 IMPLANT
SUT VIC AB 1 CTX 36 (SUTURE) ×4
SUT VIC AB 1 CTX36XBRD ANBCTR (SUTURE) ×4 IMPLANT
SUT VIC AB 2-0 CT1 27 (SUTURE) ×2
SUT VIC AB 2-0 CT1 TAPERPNT 27 (SUTURE) ×2 IMPLANT
SUT VIC AB 3-0 X1 27 (SUTURE) ×4 IMPLANT
SYSTEM SAHARA CHEST DRAIN ATS (WOUND CARE) ×4 IMPLANT
TAPE CLOTH SURG 4X10 WHT LF (GAUZE/BANDAGES/DRESSINGS) ×4 IMPLANT
TOWEL GREEN STERILE (TOWEL DISPOSABLE) ×4 IMPLANT
TOWEL GREEN STERILE FF (TOWEL DISPOSABLE) ×4 IMPLANT
TRAY FOLEY SILVER 16FR TEMP (SET/KITS/TRAYS/PACK) ×4 IMPLANT
UNDERPAD 30X30 (UNDERPADS AND DIAPERS) ×4 IMPLANT
VALVE MAGNA MITRAL 25MM (Prosthesis & Implant Heart) ×4 IMPLANT
VRC MALLEABLE SINGLE STG 34FR (MISCELLANEOUS) ×4
WATER STERILE IRR 1000ML POUR (IV SOLUTION) ×8 IMPLANT
WND VAC CANISTER 500ML (MISCELLANEOUS) ×4 IMPLANT

## 2017-05-30 SURGICAL SUPPLY — 18 items
CATH INFINITI 5FR MULTPACK ANG (CATHETERS) ×2 IMPLANT
CATH SWAN GANZ 7F STRAIGHT (CATHETERS) ×2 IMPLANT
COVER PRB 48X5XTLSCP FOLD TPE (BAG) ×2 IMPLANT
COVER PROBE 5X48 (BAG) ×2
KIT ENCORE 26 ADVANTAGE (KITS) ×2 IMPLANT
KIT HEART LEFT (KITS) ×2 IMPLANT
KIT MICROPUNCTURE NIT STIFF (SHEATH) ×2 IMPLANT
PACK CARDIAC CATHETERIZATION (CUSTOM PROCEDURE TRAY) ×2 IMPLANT
SET IMPELLA CP PUMP (CATHETERS) ×2 IMPLANT
SHEATH PINNACLE 5F 10CM (SHEATH) ×2 IMPLANT
SHEATH PINNACLE 6F 10CM (SHEATH) ×2 IMPLANT
SHEATH PINNACLE 7F 10CM (SHEATH) ×2 IMPLANT
SLEEVE REPOSITIONING LENGTH 30 (MISCELLANEOUS) ×2 IMPLANT
TRANSDUCER W/STOPCOCK (MISCELLANEOUS) ×2 IMPLANT
TRAY CATH 3LUMEN 20C SULFAFREE (CATHETERS) ×2 IMPLANT
TUBING CIL FLEX 10 FLL-RA (TUBING) ×4 IMPLANT
WIRE EMERALD 3MM-J .025X260CM (WIRE) ×2 IMPLANT
WIRE EMERALD 3MM-J .035X150CM (WIRE) ×4 IMPLANT

## 2017-05-30 NOTE — Consult Note (Signed)
PULMONARY / CRITICAL CARE MEDICINE   Name: Mark Rangel MRN: [id number] DOB: [birth year]    ADMISSION DATE:  May 31, 2017 CONSULTATION DATE:  2/28  REFERRING MD:  Dr. Cornelius Moras  CHIEF COMPLAINT:  Shock, hypoxia  HISTORY OF PRESENT ILLNESS:  Patient is encephalopathic and/or intubated. Therefore history has been obtained from chart review. 62 year old male with past medical history as below, which is significant for upper lipidemia, diabetes, and tobacco abuse.  He presented to the emergency department on 2/28 complaining of a four-day history of chest pain.  He suspected the muscle did not think much of it.  Chest pain was associated with shortness of breath, diaphoresis, and weakness.  On the 28th he suffered a syncopal episode which prompted him to present to the emergency department.  EKG upon arrival was concerning for tachycardia and marked ST depression.  He was taken emergently for cardiac catheter he was intubated.  He was also in shock with systolic blood pressures in the 60s.  Left heart catheter demonstrated no acute occlusion, however, he was found to have a ruptured mitral valve.  PCCM was consulted for shock and hypoxia, and the patient was emergently taken to the OR for cardiac surgery.  PAST MEDICAL HISTORY :  He  has a past medical history of High cholesterol.  PAST SURGICAL HISTORY: He  has a past surgical history that includes Inguinal hernia repair (Left, 05/11/2013) and Insertion of mesh (Left, 05/11/2013).  Allergies  Allergen Reactions  . Morphine And Related Other (See Comments)    Became violent    No current facility-administered medications on file prior to encounter.    Current Outpatient Medications on File Prior to Encounter  Medication Sig  . Coenzyme Q10 (CO Q-10) 100 MG CAPS Take 1 capsule by mouth daily.  . cyclobenzaprine (FLEXERIL) 10 MG tablet Take 1 tablet (10 mg total) by mouth 2 (two) times daily as needed for muscle spasms.  Marland Kitchen etanercept (ENBREL) 50  MG/ML injection Inject 50 mg into the skin 2 (two) times a week.  Marland Kitchen ibuprofen (ADVIL,MOTRIN) 600 MG tablet Take 1 tablet (600 mg total) by mouth every 6 (six) hours as needed.  Marland Kitchen oxyCODONE-acetaminophen (PERCOCET) 7.5-325 MG per tablet Take 1-2 tablets by mouth every 4 (four) hours as needed.  . rosuvastatin (CRESTOR) 40 MG tablet Take 40 mg by mouth at bedtime.  . sennosides-docusate sodium (SENOKOT-S) 8.6-50 MG tablet Take 2 tablets by mouth at bedtime.    FAMILY HISTORY:  His has no family status information on file.    SOCIAL HISTORY: He  reports that he has been smoking cigarettes.  He has a 43.00 pack-year smoking history. he has never used smokeless tobacco. He reports that he drinks alcohol. He reports that he does not use drugs.  REVIEW OF SYSTEMS:     SUBJECTIVE:   VITAL SIGNS: BP (!) 60/40 (BP Location: Right Arm)   Pulse (!) 115   Temp (!) 94 F (34.4 C) (Temporal)   Resp (!) 22   Ht 5\' 11"  (1.803 m)   Wt 102 kg (224 lb 13.9 oz)   SpO2 92%   BMI 31.36 kg/m   HEMODYNAMICS:    VENTILATOR SETTINGS:    INTAKE / OUTPUT: No intake/output data recorded.  PHYSICAL EXAMINATION: General:  Acutely ill appearing, NAD Neuro:  Sedated, dyssynchronous. HEENT:  [place]/AT, PERRL, EOM-I Cardiovascular:  IRIR, Nl S1/S2 and -M/R/G. Lungs:  Coarse BS diffusely Abdomen:  Soft, NT, ND and +BS Musculoskeletal:  -edema and -tenderness Skin:  Intact  LABS:  BMET Recent Labs  Lab 12-Jun-2017 1252 2017/06/12 1300  NA 135 134*  K 3.3* 3.4*  CL 100* 99*  CO2  --  16*  BUN 21* 18  CREATININE 1.20 1.47*  GLUCOSE 438* 440*    Electrolytes Recent Labs  Lab 06-12-17 1300  CALCIUM 8.5*    CBC Recent Labs  Lab 06/12/2017 1252 2017/06/12 1300  WBC  --  16.9*  HGB 15.0 13.7  HCT 44.0 41.9  PLT  --  233    Coag's Recent Labs  Lab Jun 12, 2017 1300  APTT 25  INR 1.08    Sepsis Markers No results for input(s): LATICACIDVEN, PROCALCITON, O2SATVEN in the last 168  hours.  ABG No results for input(s): PHART, PCO2ART, PO2ART in the last 168 hours.  Liver Enzymes Recent Labs  Lab 2017-06-12 1300  AST 68*  ALT 45  ALKPHOS 61  BILITOT 1.2  ALBUMIN 3.2*    Cardiac Enzymes Recent Labs  Lab 12-Jun-2017 1300  TROPONINI 4.13*    Glucose No results for input(s): GLUCAP in the last 168 hours.  Imaging No results found.   STUDIES:    CULTURES:   ANTIBIOTICS:   SIGNIFICANT EVENTS: 2/28 admit, to OR for emergent MVR  LINES/TUBES: ETT 2/28 > Arterial line 2/28 > CVL 2/28 > Impella 2/28>  DISCUSSION:   ASSESSMENT / PLAN:  PULMONARY A: Acute hypoxemic respiratory failure secondary to acute heart failure  P:   Full vent support Pressure control 30. Phigh 40 P low 20. Itime 0.9. High PEEP/FiO2 VAP bundle To OR for repair of MV Deep sedation and neuromuscular blockade Diuresis as indicated  CARDIOVASCULAR A:  Acute heart failure secondary to mitral valve rupture Cardiogenic shock  P:  Pressors per CVTS Impella in place.  To OR for emergent MVR.   RENAL A:   AKI hypoK hypoNa  P:   Will need to repeat once out of OR  GASTROINTESTINAL A:   No acute issues   P:   NPO PPI  HEMATOLOGIC A:   No acute issues  P:  Follow CBC, coags  INFECTIOUS A:   No acute issues  P:   Periop ABX per CVTS  ENDOCRINE A:   DM  P:   CBG monitoring and SSI  NEUROLOGIC A:   Acute metabolic encephalopathy  P:   RASS goal: -1 to -2 Fentayl, versed for sedation  FAMILY  - Updates:   - Inter-disciplinary family meet or Palliative Care meeting due by:  3/5   Joneen Roach, AGACNP-BC [facility]/Critical Care Pager [phone] or [phone]  06-12-17 3:29 PM  Attending Note:  62 year old male with a large MI who was in the cath intubated for acute flash pulmonary edema.  PCCM was consulted for vent management for inability to oxygenate patient.  On exam, diffuse crackles and decreased BS on  the right.  I reviewed chest imaging on floro and ETT is in good position with diffuse edema and fluid in the fissure.  Patient is severely dyssynchronous with the vent and once I was able to get him synchronous with PS/CPAP then paralyzed and placed on PCV we were able to get saturation improved to 98% on PCV of 30, 20/20 and Ti of 0.9.  F/U ABG when out of the OR.  OR visit now for ?mitral valve replacement.  PCCM will follow post op.  Hemodynamics to be managed by cardiology.  The patient is critically ill with multiple organ systems failure and  requires high complexity decision making for assessment and support, frequent evaluation and titration of therapies, application of advanced monitoring technologies and extensive interpretation of multiple databases.   Critical Care Time devoted to patient care services described in this note is  60  Minutes. This time reflects time of care of this signee Dr Koren BoundWesam Rhonda Linan. This critical care time does not reflect procedure time, or teaching time or supervisory time of PA/NP/Med student/Med Resident etc but could involve care discussion time.  Alyson ReedyWesam G. Saahil Herbster, M.D. [facility]. Pager: [phone]. After hours pager: [phone].

## 2017-05-30 NOTE — ED Triage Notes (Signed)
[facility] - patient reports chest pain x multiple days, today EMS was called due to fall. On EMS arrival, patient found to have had syncopal episode. Patient diaphoretic and pale. On arrival patient is pale, BP in the 60's, a/o x4. Placed on zoll.

## 2017-05-30 NOTE — Progress Notes (Signed)
Transported pt from OR 14 to 2H08 without complications.

## 2017-05-30 NOTE — H&P (Signed)
Cardiology Admission History and Physical:   Patient ID: Mark Rangel; MRN: [id number]; DOB: 09/07/1955   Admission date: 04-23-2017  Primary Care Provider: Kari Rangel, Edward, MD Primary Cardiologist: No primary care provider on file.   Chief Complaint: Chest pain/shortness of breath  Patient Profile:   Mark Rangel is a 62 y.o. male with a history of tobacco abuse, hypercholesterolemia, and diabetes.  He has no past cardiac history.  He presents with chest pain, shortness of breath, and syncope.  He has sinus tachycardia and marked ST changes on EKG.  A code STEMI is called, he is evaluated quickly in the emergency department then brought directly to the cardiac catheterization lab with a blood pressure in the 60s.  History of Present Illness:   [name] complains of chest pain about 4 days ago.  He thought he had a muscle pull.  He has been short of breath since that time.  He complains of diaphoresis and weakness.  Today he had syncope and EMS was called.  On their arrival his EKG demonstrates sinus tachycardia with marked ST segment depression suspicious for acute posterior injury or severe diffuse ischemia.  A code STEMI is called from the field.  At the time of my evaluation in the emergency department the patient feels poorly.  It is difficult to obtain much history from him.  He does complain of shortness of breath.  He has been having chest pain on and off for 4 days.   Past Medical History:  Diagnosis Date  . High cholesterol     Past Surgical History:  Procedure Laterality Date  . INGUINAL HERNIA REPAIR Left 05/11/2013   Procedure: HERNIA REPAIR INGUINAL ADULT;  Surgeon: Mark HeadingMark A Jenkins, MD;  Location: [facility];  Service: General;  Laterality: Left;  . INSERTION OF MESH Left 05/11/2013   Procedure: INSERTION OF MESH;  Surgeon: Mark HeadingMark A Jenkins, MD;  Location: [facility];  Service: General;  Laterality: Left;     Medications Prior to Admission: Prior to Admission medications     Medication Sig Start Date End Date Taking? Authorizing Provider  Coenzyme Q10 (CO Q-10) 100 MG CAPS Take 1 capsule by mouth daily.    [provider]  cyclobenzaprine (FLEXERIL) 10 MG tablet Take 1 tablet (10 mg total) by mouth 2 (two) times daily as needed for muscle spasms. 01/02/17   Mark Rangel, Bowie, PA-C  etanercept (ENBREL) 50 MG/ML injection Inject 50 mg into the skin 2 (two) times a week.    [provider]  ibuprofen (ADVIL,MOTRIN) 600 MG tablet Take 1 tablet (600 mg total) by mouth every 6 (six) hours as needed. 01/02/17   Mark Rangel, Bowie, PA-C  oxyCODONE-acetaminophen (PERCOCET) 7.5-325 MG per tablet Take 1-2 tablets by mouth every 4 (four) hours as needed. 05/11/13   Mark Rangel, Mark, MD  rosuvastatin (CRESTOR) 40 MG tablet Take 40 mg by mouth at bedtime.    [provider]  sennosides-docusate sodium (SENOKOT-S) 8.6-50 MG tablet Take 2 tablets by mouth at bedtime.    [provider]     Allergies:    Allergies  Allergen Reactions  . Morphine And Related Other (See Comments)    Became violent    Social History:   Social History   Socioeconomic History  . Marital status: Married    Spouse name: Not on file  . Number of children: Not on file  . Years of education: Not on file  . Highest education level: Not on file  Social Needs  .  Financial resource strain: Not on file  . Food insecurity - worry: Not on file  . Food insecurity - inability: Not on file  . Transportation needs - medical: Not on file  . Transportation needs - non-medical: Not on file  Occupational History  . Not on file  Tobacco Use  . Smoking status: Current Every Day Smoker    Packs/day: 1.00    Years: 43.00    Pack years: 43.00    Types: Cigarettes  . Smokeless tobacco: Never Used  Substance and Sexual Activity  . Alcohol use: Yes    Comment: 8 beers per month  . Drug use: No  . Sexual activity: Not on file  Other Topics Concern  . Not on file  Social History Narrative   . Not on file    Family History:  The patient's family history is not on file.  Unknown family history secondary to the patient's critical illness not able to obtain details.  ROS:  Please see the history of present illness.  All other ROS reviewed and negative.     Physical Exam/Data:   Vitals:   05/12/2017 1250 05/28/2017 1305  BP: (!) 60/40   Pulse: (!) 115   Resp: (!) 22   Temp: (!) 94 F (34.4 C)   TempSrc: Temporal   SpO2: 93% 92%  Weight:  224 lb 13.9 oz (102 kg)  Height:  5\' 11"  (1.803 m)   No intake or output data in the 24 hours ending 05/10/2017 1503 Filed Weights   05/26/2017 1305  Weight: 224 lb 13.9 oz (102 kg)   Body mass index is 31.36 kg/m.  General:  Well nourished, well developed, acutely ill patient, pale and hypotensive in respiratory distress in HEENT: normal Lymph: no adenopathy Neck: Positive JVD Endocrine:  No thryomegaly Vascular: No carotid bruits; FA pulses 1+ bilaterally Cardiac:  Tachy and regular; no murmur  Lungs:  rales bilaterally, no wheezing, rhonchi or rales  Abd: soft, nontender, no hepatomegaly  Ext: no edema Musculoskeletal:  No deformities, BUE and BLE strength normal and equal Skin: pale, diaphoretic Neuro:  CNs 2-12 intact, no focal abnormalities noted   EKG:  The ECG that was done today was personally reviewed and demonstrates sinus tach, marked ST depression c/w posterior infarction, inferior STE  Relevant CV Studies: pending  Laboratory Data:  Chemistry Recent Labs  Lab 05/15/2017 1252 05/15/2017 1300  NA 135 134*  K 3.3* 3.4*  CL 100* 99*  CO2  --  16*  GLUCOSE 438* 440*  BUN 21* 18  CREATININE 1.20 1.47*  CALCIUM  --  8.5*  GFRNONAA  --  50*  GFRAA  --  58*  ANIONGAP  --  19*    Recent Labs  Lab 05/21/2017 1300  PROT 5.9*  ALBUMIN 3.2*  AST 68*  ALT 45  ALKPHOS 61  BILITOT 1.2   Hematology Recent Labs  Lab 05/22/2017 1252 05/14/2017 1300  WBC  --  16.9*  RBC  --  4.46  HGB 15.0 13.7  HCT 44.0 41.9   MCV  --  93.9  MCH  --  30.7  MCHC  --  32.7  RDW  --  13.7  PLT  --  233   Cardiac Enzymes Recent Labs  Lab 05/29/2017 1300  TROPONINI 4.13*    Recent Labs  Lab 05/14/2017 1251  TROPIPOC 4.76*    BNPNo results for input(s): BNP, PROBNP in the last 168 hours.  DDimer No results for input(s): DDIMER  in the last 168 hours.  Radiology/Studies:  No results found.  Assessment and Plan:   1.  Acute inferoposterior myocardial infarction 2.  Acute cardiogenic shock with syncope and systolic blood pressure of 60 mmHg on arrival 3.  Acute respiratory failure likely secondary to pulmonary edema 4.  Type 2 diabetes on metformin 5.  Hypercholesterolemia  The patient is critically ill in cardiogenic shock.  He is taken emergently to the cardiac catheterization lab with plans for cardiac catheterization, primary PCI, and probably placement of hemodynamic support.  Emergency implied consent is obtained.  Further plans/disposition pending findings in the Cath Lab.  Severity of Illness: The appropriate patient status for this patient is INPATIENT. Inpatient status is judged to be reasonable and necessary in order to provide the required intensity of service to ensure the patient's safety. The patient's presenting symptoms, physical exam findings, and initial radiographic and laboratory data in the context of their chronic comorbidities is felt to place them at high risk for further clinical deterioration. Furthermore, it is not anticipated that the patient will be medically stable for discharge from the hospital within 2 midnights of admission. The following factors support the patient status of inpatient.   * I certify that at the point of admission it is my clinical judgment that the patient will require inpatient hospital care spanning beyond 2 midnights from the point of admission due to high intensity of service, high risk for further deterioration and high frequency of surveillance  required.*   For questions or updates, please contact [facility] Please consult [email or web address] for contact info under Cardiology/STEMI.    Signed, Tonny Bollman, MD  Jun 27, 2017 3:03 PM

## 2017-05-30 NOTE — Anesthesia Procedure Notes (Signed)
Central Venous Catheter Insertion Performed by: Roberts Gaudy, MD, anesthesiologist Start/End02/16/2019 4:45 PM, 05/28/2017 4:50 PM Patient location: Pre-op. Preanesthetic checklist: patient identified, IV checked, site marked, risks and benefits discussed, surgical consent, monitors and equipment checked, pre-op evaluation, timeout performed and anesthesia consent Lidocaine 1% used for infiltration and patient sedated Hand hygiene performed  and maximum sterile barriers used  Catheter size: 8.5 Fr MAC introducer Procedure performed using ultrasound guided technique. Ultrasound Notes:anatomy identified, needle tip was noted to be adjacent to the nerve/plexus identified, no ultrasound evidence of intravascular and/or intraneural injection and image(s) printed for medical record Attempts: 1 Following insertion, line sutured and dressing applied. Post procedure assessment: blood return through all ports, free fluid flow and no air  Patient tolerated the procedure well with no immediate complications.

## 2017-05-30 NOTE — Progress Notes (Signed)
     ADVANCED HF TEAM CONSULT NOTE  The Advanced HF team was asked to see [name] urgently by Dr. Excell Seltzerooper for assistance with management of profound cardiogenic shock.  [name] is a 62 y/o smoker with h/o obesity, COPD, HTN and DM2. No known h/o CAD.   He reported a 4-day h/o CP with progressive dyspnea. Recently seen at Urgent Care and treated for URI with abx and steroid injection. This am he had syncopal episdoe and EMS was called.  On their arrival, his EKG demonstrated sinus tachycardia with inferior ST elevation and marked ST segment depression suspicious for acute inferior/posterior injury. (Personally reviewed).  A code STEMI was called. On arrival to the ER he was in marked respiratory distress with SBP in 60s.  He was brought emergently to the cath lab where he was intubated and started on pressors.   On exam Obese male lying on cath lab table Cyanotic and pale ETT in place (ETT placement confirmed by fluoro) Cor Tachy regular no obvious MR murmur Lungs diffuse cracles Ab: obese soft NT EXt: cool mildly mottled.  Neruo: intubated sedated   Cardiac cath by Dr. Excell Seltzerooper revealed   LM: normal LAD: nonobstuctive CAD LCx: subacute total occlusion in ostial vessel RCA: CTO with left to right collaterals LV gram: EF 35-40% with suspicion for significant MR. LVEDP 38 RHC data showed  RA 21  PA 77/40 (54) Fick 3.7/1.7  Impella placed by Dr. Excell Seltzerooper emergently but limited by ectopy.   Patient with ongoing hypotension, acidemia and hypoxemia. NE titrated up and lasix 80mg  IV x 2 given with multiple rounds of bicarb.   Developed multiples episodes of torsades and defibrillated x 3. Magnesium sulfate 2g given.  I performed bedside echo which showed EF 35-40% with probable flail posterior MV leaflet. Echo tech was then paged and emergent TEE performed with flail posterior MV leaflet and prolapse of anterior leaflet. TCTS paged  Unfortunately patient developed refractory  hypoxemia with sats 78-82% despite full vent support. Dr. Cornelius Moraswen arrived in cath lab and decision was made to take OR for ECMO cannulation.   I called the [facility]O team personally and discussed the case. Given refractory shock and hypoxemia they were in agreement with plans to place on ECMO and their team was mobilized. I also paged Dr. Molli KnockYacoub from [facility] who evaluated the patient and noted vent dyssynchrony. THe patient was paralyzed and switched to pressure support with immediate improvement in oxygenation with sats ~ 95%. The [facility] team was recontacted and told to not mobilize. The patient was then taken to OR with Dr. Laneta SimmersBartle for emergent MVR.    CRITICAL CARE Performed by: Arvilla MeresBensimhon, Daniel  Total critical care time: 100 minutes  Critical care time was exclusive of separately billable procedures and treating other patients.  Critical care was necessary to treat or prevent imminent or life-threatening deterioration.  Critical care was time spent personally by me (independent of midlevel providers or residents) on the following activities: development of treatment plan with patient and/or surrogate as well as nursing, discussions with consultants, evaluation of patient's response to treatment, examination of patient, obtaining history from patient or surrogate, ordering and performing treatments and interventions, ordering and review of laboratory studies, ordering and review of radiographic studies, pulse oximetry and re-evaluation of patient's condition.  Arvilla Meresaniel Bensimhon, MD  11:05 PM

## 2017-05-30 NOTE — Transfer of Care (Signed)
Immediate Anesthesia Transfer of Care Note  Patient: Mark Rangel  Procedure(s) Performed: MITRAL VALVE (MV) REPLACEMENT (N/A Chest)  Patient Location: SICU  Anesthesia Type:General  Level of Consciousness: Patient remains intubated per anesthesia plan  Airway & Oxygen Therapy: Patient remains intubated per anesthesia plan and Patient placed on Ventilator (see vital sign flow sheet for setting)  Post-op Assessment: Report given to RN, Post -op Vital signs reviewed and unstable, Anesthesiologist notified and critically ill on high dose vasopressors  Post vital signs: unstable  Last Vitals:  Vitals:   05/04/2017 1521 05/29/2017 2321  BP: 99/67 (!) 89/50  Pulse: (!) 135 (!) 112  Resp: 20   Temp:    SpO2: 95% 92%    Last Pain:  Vitals:   05/09/2017 1429  TempSrc:   PainSc: Asleep         Complications: No apparent anesthesia complications

## 2017-05-30 NOTE — CV Procedure (Signed)
    TRANSESOPHAGEAL ECHOCARDIOGRAM   NAME:  Mark Rangel   MRN: [id number] DOB:  07-28-1955   ADMIT DATE: 2017-06-22  INDICATIONS: Mitral regurgitation    PROCEDURE:   TEE performed under emergent conditions due to suspected flail mitral valve leaflet in setting of acute inferior/posterior MI, with cardiogenic shock.  Patient intubated/sedate. Emergent consent implied.   COMPLICATIONS:    There were no immediate complications.  FINDINGS:  LEFT VENTRICLE: Dilated. EF = 35-40%. Akinesis of inferior/posterior walls. No VSD.   RIGHT VENTRICLE: Normal size. Mild HK  LEFT ATRIUM: Dilated  LEFT ATRIAL APPENDAGE: Not well seen.   RIGHT ATRIUM: Normal  AORTIC VALVE:  Trileaflet. No AI/AS  MITRAL VALVE:    Flail posterior leaflet with evidence of prolapse of anterior leaflet as well. Severe posterior MR  TRICUSPID VALVE: Normal. Mild TR  PULMONIC VALVE: Grossly normal.  INTERATRIAL SEPTUM: No PFO or ASD.  PERICARDIUM: Small posterior effusion   DESCENDING AORTA: Mild plaque.    Daniel Bensimhon,MD 11:10 PM

## 2017-05-30 NOTE — Progress Notes (Signed)
  Echocardiogram Echocardiogram Transesophageal has been performed.  Mark Rangel T Alden Feagan 01/08/2018, 5:01 PM

## 2017-05-30 NOTE — ED Provider Notes (Signed)
[facility] CATH [facility] Provider Note   CSN: [id number] Arrival date & time: 05/23/2017  1242     History   Chief Complaint Chief Complaint  Patient presents with  . Chest Pain  . Shortness of Breath    HPI Mark Rangel is a 62 y.o. male.  HPI   Patient is a 62 year old male presenting to the emergency did not department with chest pain shortness of breath.  Patient called code STEMI in the field given for evidence of posterior STEMI on EKG.  EKG was faxed here and I discussed it with cardiac team.  Decision made to call STEMI.  Patient came lights nad sirens.  On arrival patient hypoxic 92% on 6 L, tachypneic to 30s, hypotensive to 60/40 and tachycardic in the 120s.  Patient appeared very ill. Past Medical History:  Diagnosis Date  . High cholesterol     There are no active problems to display for this patient.   Past Surgical History:  Procedure Laterality Date  . INGUINAL HERNIA REPAIR Left 05/11/2013   Procedure: HERNIA REPAIR INGUINAL ADULT;  Surgeon: Jamesetta So, MD;  Location: [facility];  Service: General;  Laterality: Left;  . INSERTION OF MESH Left 05/11/2013   Procedure: INSERTION OF MESH;  Surgeon: Jamesetta So, MD;  Location: [facility];  Service: General;  Laterality: Left;       Home Medications    Prior to Admission medications   Medication Sig Start Date End Date Taking? Authorizing Provider  Coenzyme Q10 (CO Q-10) 100 MG CAPS Take 1 capsule by mouth daily.    [provider]  cyclobenzaprine (FLEXERIL) 10 MG tablet Take 1 tablet (10 mg total) by mouth 2 (two) times daily as needed for muscle spasms. 01/02/17   Domenic Moras, PA-C  etanercept (ENBREL) 50 MG/ML injection Inject 50 mg into the skin 2 (two) times a week.    [provider]  ibuprofen (ADVIL,MOTRIN) 600 MG tablet Take 1 tablet (600 mg total) by mouth every 6 (six) hours as needed. 01/02/17   Domenic Moras, PA-C  oxyCODONE-acetaminophen (PERCOCET)  7.5-325 MG per tablet Take 1-2 tablets by mouth every 4 (four) hours as needed. 05/11/13   Aviva Signs, MD  rosuvastatin (CRESTOR) 40 MG tablet Take 40 mg by mouth at bedtime.    [provider]  sennosides-docusate sodium (SENOKOT-S) 8.6-50 MG tablet Take 2 tablets by mouth at bedtime.    [provider]    Family History No family history on file.  Social History Social History   Tobacco Use  . Smoking status: Current Every Day Smoker    Packs/day: 1.00    Years: 43.00    Pack years: 43.00    Types: Cigarettes  . Smokeless tobacco: Never Used  Substance Use Topics  . Alcohol use: Yes    Comment: 8 beers per month  . Drug use: No     Allergies   Morphine and related   Review of Systems Review of Systems  Unable to perform ROS: Acuity of condition     Physical Exam Updated Vital Signs There were no vitals taken for this visit.  Physical Exam  Constitutional: He is oriented to person, place, and time.  Patient is pale diaphoretic very ill-appearing.  HENT:  Head: Normocephalic.  Eyes: Conjunctivae are normal.  Cardiovascular: Intact distal pulses and normal pulses. Tachycardia present.  Pulmonary/Chest: Tachypnea noted.  Patient tachypneic, with normal breath sounds.  Abdominal: Soft.  Musculoskeletal:  Right lower leg: Normal. He exhibits no edema.       Left lower leg: Normal. He exhibits no edema.  Neurological: He is oriented to person, place, and time.  Skin: Skin is warm and dry. He is not diaphoretic.  Psychiatric: He has a normal mood and affect. His behavior is normal.  Nursing note and vitals reviewed.    ED Treatments / Results  Labs (all labs ordered are listed, but only abnormal results are displayed) Labs Reviewed  I-STAT CHEM 8, ED - Abnormal; Notable for the following components:      Result Value   Potassium 3.3 (*)    Chloride 100 (*)    BUN 21 (*)    Glucose, Bld 438 (*)    Calcium, Ion 1.05 (*)    TCO2 18  (*)    All other components within normal limits    EKG  EKG Interpretation  Date/Time:  Thursday May 30 2017 12:46:49 EST Ventricular Rate:  114 PR Interval:    QRS Duration: 138 QT Interval:  293 QTC Calculation: 404 R Axis:   79 Text Interpretation:  Confirmed by Zenovia Jarred [phone]) on 05/29/2017 2:10:31 PM       Radiology No results found.  Procedures Procedures (including critical care time)  CRITICAL CARE Performed by: Gardiner Sleeper Total critical care time: 45 minutes Critical care time was exclusive of separately billable procedures and treating other patients. Critical care was necessary to treat or prevent imminent or life-threatening deterioration. Critical care was time spent personally by me on the following activities: development of treatment plan with patient and/or surrogate as well as nursing, discussions with consultants, evaluation of patient's response to treatment, examination of patient, obtaining history from patient or surrogate, ordering and performing treatments and interventions, ordering and review of laboratory studies, ordering and review of radiographic studies, pulse oximetry and re-evaluation of patient's condition.   Medications Ordered in ED Medications  heparin bolus via infusion 4,000 Units ( Intravenous Automatically Held 05/25/2017 1300)  heparin ADULT infusion 100 units/mL (25000 units/246m sodium chloride 0.45%) (not administered)  aspirin chewable tablet 324 mg ( Oral Automatically Held 05/23/2017 1300)     Initial Impression / Assessment and Plan / ED Course  I have reviewed the triage vital signs and the nursing notes.  Pertinent labs & imaging results that were available during my care of the patient were reviewed by me and considered in my medical decision making (see chart for details).     Patient is a 62year old male presenting to the emergency did not department with chest pain shortness of breath.   Patient called code STEMI in the field given for evidence of posterior STEMI on EKG.  EKG was faxed here and I discussed it with cardiac team.  Decision made to call STEMI.  Patient came lights nad sirens.  On arrival patient hypoxic 92% on 6 L, tachypneic to 30s, hypotensive to 60/40 and tachycardic in the 120s.  Patient appeared very ill.  Consideration that this could be a large PE versus MI.  Dr. CBurt Knack cardiology met at bedside.  Bedside echo performed showing no evidence of giant RV.  Decision made to take to immediately to Cath Lab.  Heparin bolus given.  Aspirin given.  Will go to cath lab.   Final Clinical Impressions(s) / ED Diagnoses   Final diagnoses:  None    ED Discharge Orders    None       Brooks Stotz, CFredia Sorrow MD 05/12/2017 1414

## 2017-05-30 NOTE — Brief Op Note (Signed)
05/16/2017  7:36 AM  PATIENT:  Mark Rangel  62 y.o. male  PRE-OPERATIVE DIAGNOSIS:  Heart Failure  POST-OPERATIVE DIAGNOSIS:  Heart Failure  PROCEDURE:  Procedure(s):  MITRAL VALVE REPLACEMENT  -25 Edwards Magna Ease Pericardial Tissue Valve  REMOVAL OF IMPELLA  SURGEON:  Surgeon(s) and Role:    * Bartle, Payton DoughtyBryan K, MD - Primary  PHYSICIAN ASSISTANT: Jacorion Klem PA-C  ANESTHESIA:   general  EBL:  800 mL   BLOOD ADMINISTERED:CELLSAVER  DRAINS: Left and Right Pleural Chest Tubes, Mediastinal Chest Drains   LOCAL MEDICATIONS USED:  NONE  SPECIMEN:  Source of Specimen:  Mitral Valve, Papillary Muscle  DISPOSITION OF SPECIMEN:  PATHOLOGY  COUNTS:  YES  TOURNIQUET:  * No tourniquets in log *  DICTATION: .Dragon Dictation  PLAN OF CARE: Admit to inpatient   PATIENT DISPOSITION:  ICU - intubated and hemodynamically stable.   Delay start of Pharmacological VTE agent (>24hrs) due to surgical blood loss or risk of bleeding: yes

## 2017-05-30 NOTE — Progress Notes (Signed)
  Echocardiogram Echocardiogram Transesophageal has been performed.  Leta JunglingCooper, Reed Eifert M 22-Sep-2017, 3:34 PM

## 2017-05-30 NOTE — Anesthesia Preprocedure Evaluation (Addendum)
Anesthesia Evaluation  Patient identified by MRN, date of birth, ID band Patient unresponsive    Reviewed: Patient's Chart, lab work & pertinent test results, Unable to perform ROS - Chart review only  Airway Mallampati: Intubated       Dental   Pulmonary Current Smoker,    + rhonchi  + decreased breath sounds  rales    Cardiovascular  Rhythm:Regular Rate:Tachycardia     Neuro/Psych    GI/Hepatic   Endo/Other    Renal/GU      Musculoskeletal   Abdominal   Peds  Hematology   Anesthesia Other Findings   Reproductive/Obstetrics                            Anesthesia Physical Anesthesia Plan  ASA: IV and emergent  Anesthesia Plan: General   Post-op Pain Management:    Induction: Intravenous  PONV Risk Score and Plan:   Airway Management Planned: Oral ETT  Additional Equipment: Arterial line, CVP, PA Cath, 3D TEE and Ultrasound Guidance Line Placement  Intra-op Plan:   Post-operative Plan: Post-operative intubation/ventilation  Informed Consent: I have reviewed the patients History and Physical, chart, labs and discussed the procedure including the risks, benefits and alternatives for the proposed anesthesia with the patient or authorized representative who has indicated his/her understanding and acceptance.     Plan Discussed with: CRNA and Anesthesiologist  Anesthesia Plan Comments:         Anesthesia Quick Evaluation

## 2017-05-30 NOTE — Anesthesia Procedure Notes (Addendum)
Central Venous Catheter Insertion Performed by: Kipp BroodJoslin, Khadar Monger, MD, anesthesiologist Start/End02/07/2017 4:45 PM, 05/30/2017 4:50 PM Patient location: OR. Preanesthetic checklist: patient identified, IV checked, site marked, risks and benefits discussed, surgical consent, monitors and equipment checked, pre-op evaluation, timeout performed and anesthesia consent Position: supine Hand hygiene performed  and maximum sterile barriers used  Catheter size: 9 Fr PA cath was placed.Swan type:thermodilution Procedure performed without using ultrasound guided technique. Ultrasound Notes:anatomy identified, needle tip was noted to be adjacent to the nerve/plexus identified and no ultrasound evidence of intravascular and/or intraneural injection Attempts: 1 Following insertion, line sutured, dressing applied and Biopatch. Post procedure assessment: blood return through all ports  Patient tolerated the procedure well with no immediate complications.

## 2017-05-30 NOTE — OR Nursing (Signed)
5mL and 10mL syringe without plungers sutured into place to hold sternum open per Dr. Evelene CroonBryan Bartle.

## 2017-05-30 NOTE — Anesthesia Procedure Notes (Addendum)
Procedure Name: Intubation Date/Time: 05/24/2017 1:13 PM Performed by: Oletta Lamas, CRNA Pre-anesthesia Checklist: Patient identified, Emergency Drugs available, Suction available and Patient being monitored Patient Re-evaluated:Patient Re-evaluated prior to induction Oxygen Delivery Method: Ambu bag Preoxygenation: Pre-oxygenation with 100% oxygen Induction Type: IV induction, Rapid sequence and Cricoid Pressure applied Ventilation: Mask ventilation without difficulty Laryngoscope Size: Mac and 3 Grade View: Grade I Tube type: Oral Tube size: 7.5 mm Number of attempts: 1 Airway Equipment and Method: Stylet Placement Confirmation: ETT inserted through vocal cords under direct vision,  positive ETCO2 and breath sounds checked- equal and bilateral Secured at: 23 cm Tube secured with: Tape Dental Injury: Teeth and Oropharynx as per pre-operative assessment  Comments: Dentures removed prior to intubation

## 2017-05-30 NOTE — Anesthesia Procedure Notes (Signed)
Arterial Line Insertion Start/End02/05/2017 4:55 PM, 05/30/2017 5:00 PM Performed by: Kipp BroodJoslin, Yehudit Fulginiti, MD, anesthesiologist  Patient location: OR. Preanesthetic checklist: patient identified, site marked, pre-op evaluation and timeout performed Left, brachial was placed Catheter size: 20 G Seldinger technique used  Attempts: 2 Procedure performed without using ultrasound guided technique. Ultrasound Notes:anatomy identified, needle tip was noted to be adjacent to the nerve/plexus identified and no ultrasound evidence of intravascular and/or intraneural injection Following insertion, dressing applied and Biopatch. Patient tolerated the procedure well with no immediate complications.

## 2017-05-31 ENCOUNTER — Inpatient Hospital Stay (HOSPITAL_COMMUNITY): Payer: BLUE CROSS/BLUE SHIELD

## 2017-05-31 ENCOUNTER — Encounter (HOSPITAL_COMMUNITY): Payer: Self-pay | Admitting: Cardiovascular Disease

## 2017-05-31 LAB — POCT I-STAT, CHEM 8
BUN: 17 mg/dL (ref 6–20)
CALCIUM ION: 0.67 mmol/L — AB (ref 1.15–1.40)
CHLORIDE: 93 mmol/L — AB (ref 101–111)
Creatinine, Ser: 1.4 mg/dL — ABNORMAL HIGH (ref 0.61–1.24)
Glucose, Bld: 278 mg/dL — ABNORMAL HIGH (ref 65–99)
HCT: 17 % — ABNORMAL LOW (ref 39.0–52.0)
Hemoglobin: 5.8 g/dL — CL (ref 13.0–17.0)
Potassium: 2.1 mmol/L — CL (ref 3.5–5.1)
Sodium: 164 mmol/L (ref 135–145)
TCO2: 48 mmol/L — ABNORMAL HIGH (ref 22–32)

## 2017-05-31 LAB — BPAM RBC
BLOOD PRODUCT EXPIRATION DATE: 201903182359
BLOOD PRODUCT EXPIRATION DATE: 201903182359
BLOOD PRODUCT EXPIRATION DATE: 201903182359
Blood Product Expiration Date: 201903182359
ISSUE DATE / TIME: 201902281547
ISSUE DATE / TIME: 201902281547
ISSUE DATE / TIME: 201902281547
ISSUE DATE / TIME: 201902281547
UNIT TYPE AND RH: 6200
Unit Type and Rh: 6200
Unit Type and Rh: 6200
Unit Type and Rh: 6200

## 2017-05-31 LAB — POCT I-STAT 3, ART BLOOD GAS (G3+)
ACID-BASE DEFICIT: 5 mmol/L — AB (ref 0.0–2.0)
Acid-base deficit: 2 mmol/L (ref 0.0–2.0)
BICARBONATE: 21 mmol/L (ref 20.0–28.0)
Bicarbonate: 22.1 mmol/L (ref 20.0–28.0)
O2 SAT: 99 %
O2 Saturation: 73 %
PCO2 ART: 34.4 mmHg (ref 32.0–48.0)
PO2 ART: 131 mmHg — AB (ref 83.0–108.0)
Patient temperature: 36
Patient temperature: 37.1
TCO2: 22 mmol/L (ref 22–32)
TCO2: 23 mmol/L (ref 22–32)
pCO2 arterial: 43.3 mmHg (ref 32.0–48.0)
pH, Arterial: 7.294 — ABNORMAL LOW (ref 7.350–7.450)
pH, Arterial: 7.412 (ref 7.350–7.450)
pO2, Arterial: 36 mmHg — CL (ref 83.0–108.0)

## 2017-05-31 LAB — TYPE AND SCREEN
ABO/RH(D): A POS
Antibody Screen: NEGATIVE
UNIT DIVISION: 0
UNIT DIVISION: 0
Unit division: 0
Unit division: 0

## 2017-05-31 LAB — ECHO TEE
HEIGHTINCHES: 71 in
WEIGHTICAEL: 3597.91 [oz_av]

## 2017-05-31 MED ORDER — MIDAZOLAM HCL 2 MG/2ML IJ SOLN: 2.0000 mg | Freq: Once | INTRAMUSCULAR | Status: DC

## 2017-05-31 MED ORDER — FENTANYL BOLUS VIA INFUSION
50.0000 ug | INTRAVENOUS | Status: DC | PRN
  Filled 2017-05-31: qty 50

## 2017-05-31 MED ORDER — ARTIFICIAL TEARS OPHTHALMIC OINT
1.0000 "application " | TOPICAL_OINTMENT | Freq: Three times a day (TID) | OPHTHALMIC | Status: DC
  Administered 2017-05-31: 1 via OPHTHALMIC
  Filled 2017-05-31 (×2): qty 3.5

## 2017-05-31 MED ORDER — ARTIFICIAL TEARS OPHTHALMIC OINT: TOPICAL_OINTMENT | Freq: Three times a day (TID) | OPHTHALMIC | Status: DC

## 2017-05-31 MED ORDER — MIDAZOLAM BOLUS VIA INFUSION
2.0000 mg | INTRAVENOUS | Status: DC | PRN
  Filled 2017-05-31: qty 2

## 2017-05-31 MED ORDER — FENTANYL 2500MCG IN NS 250ML (10MCG/ML) PREMIX INFUSION: 100.0000 ug/h | INTRAVENOUS | Status: DC

## 2017-05-31 MED ORDER — SODIUM BICARBONATE 8.4 % IV SOLN
100.0000 meq | Freq: Once | INTRAVENOUS | Status: AC
  Administered 2017-05-31: 100 meq via INTRAVENOUS

## 2017-05-31 MED ORDER — SODIUM BICARBONATE 8.4 % IV SOLN
INTRAVENOUS | Status: AC
  Filled 2017-05-31: qty 100

## 2017-05-31 MED ORDER — MIDAZOLAM HCL 50 MG/10ML IJ SOLN
0.5000 mg/h | INTRAMUSCULAR | Status: DC
  Filled 2017-05-31: qty 10

## 2017-05-31 MED ORDER — SODIUM CHLORIDE 0.9 % IV SOLN
2.0000 mg/h | INTRAVENOUS | Status: DC
  Administered 2017-05-31 (×2): 2 mg/h via INTRAVENOUS
  Filled 2017-05-31: qty 10

## 2017-05-31 MED ORDER — SODIUM CHLORIDE 0.9 % IV SOLN
3.0000 ug/kg/min | INTRAVENOUS | Status: DC
  Filled 2017-05-31: qty 20

## 2017-05-31 MED ORDER — FENTANYL CITRATE (PF) 100 MCG/2ML IJ SOLN: 100.0000 ug | Freq: Once | INTRAMUSCULAR | Status: DC | PRN

## 2017-05-31 MED ORDER — FENTANYL CITRATE (PF) 100 MCG/2ML IJ SOLN
100.0000 ug | Freq: Once | INTRAMUSCULAR | Status: AC
  Administered 2017-05-31: 100 ug via INTRAVENOUS

## 2017-05-31 MED ORDER — MIDAZOLAM HCL 2 MG/2ML IJ SOLN: 2.0000 mg | Freq: Once | INTRAMUSCULAR | Status: DC | PRN

## 2017-05-31 MED ORDER — STERILE WATER FOR INJECTION IV SOLN
INTRAVENOUS | Status: DC
  Administered 2017-05-31: 01:00:00 via INTRAVENOUS
  Filled 2017-05-31 (×2): qty 850

## 2017-05-31 MED FILL — Thrombin For Soln 20000 Unit: CUTANEOUS | Qty: 1 | Status: AC

## 2017-05-31 MED FILL — Lidocaine HCl Local Inj 1%: INTRAMUSCULAR | Qty: 20 | Status: AC

## 2017-05-31 DEATH — deceased

## 2017-06-04 MED FILL — Electrolyte-R (PH 7.4) Solution: INTRAVENOUS | Qty: 3000 | Status: AC

## 2017-06-04 MED FILL — Sodium Chloride IV Soln 0.9%: INTRAVENOUS | Qty: 3000 | Status: AC

## 2017-06-04 MED FILL — Mannitol IV Soln 20%: INTRAVENOUS | Qty: 500 | Status: AC

## 2017-06-04 MED FILL — Heparin Sodium (Porcine) Inj 1000 Unit/ML: INTRAMUSCULAR | Qty: 20 | Status: AC

## 2017-06-04 MED FILL — Sodium Bicarbonate IV Soln 8.4%: INTRAVENOUS | Qty: 200 | Status: AC

## 2017-06-04 MED FILL — Lidocaine HCl IV Inj 20 MG/ML: INTRAVENOUS | Qty: 5 | Status: AC

## 2017-07-01 NOTE — Progress Notes (Signed)
Patient ID: Mark Rangel, male   DOB: June 30, 1955, 62 y.o.   MRN: [id number]  Cardiothoracic Surgery   The patient was initially fairly stable after returning from the OR but suddenly decompensated with marked hypotension despite increasing Levophed and epinephrine and giving fluid. His oxygen sats dropped into the 70's. The pacemaker stopped capturing. His ABG showed correction of his acidosis. CXR showed no PTX. Minimal chest tube output and no suspicion of tamponade.  He completely lost his BP and I did not feel that there was anything else to do for this critically ill patient.

## 2017-07-01 NOTE — Op Note (Signed)
CARDIOVASCULAR SURGERY OPERATIVE NOTE  05/05/2017  Surgeon:  Alleen BorneBryan K. Bartle, MD  First Assistant: Lowella DandyErin Barrett,  PA-C   Preoperative Diagnosis:  Acute STEMI due to left circumflex artery occlusion with papillary muscle rupture and flail anterior mitral leaflet with acute pulmonary edema and cardiogenic shock.   Postoperative Diagnosis:  Same   Procedure:  1. Median Sternotomy 2. Extracorporeal circulation 3.   Mitral valve replacement using a 25 mm Edwards Magna-Ease pericardial valve 4.   Removal of left femoral Impella with repair of left common femoral artery.   Anesthesia:  General Endotracheal   Clinical History/Surgical Indication:  The patient is a 62 year old heavy smoker with DM, HTN and hyperlipidemia who presented with a 4 day history of chest pain with progressive shortness of breath and was treated for a URI with antibiotic and steroids. This am he had a syncopal episode and was not very responsive according to his wife and EMS was called. On arrival an ECG showed sinus tach with inferior ST elevation and marked ST segment depression suspicious for acute inferior/posterior injury. A code STEMI was called. On arrival to ER he was in marked respiratory distress and shock with a SBP in the 60's. He was taken to cath lab emergently and intubated. Cath showed occlusion of the ostial LCX and chronic total occlusion of the RCA. There was moderate LV systolic dysfunction and a markedly elevated LVEDP of 38. An Impella CP was inserted via the left common femoral artery for hemodynamic support. A TEE showed a flail anterior mitral valve leaflet with severe MR due to a ruptured papillary muscle. It was very difficult to oxygenate and ventilate him in the cath lab but with some ventilator adjustments by CCM his oxygenation improved some with PO2 of 80 on 100% FiO2. He was taken to the OR  emergently for surgical repair. Dr. Excell Seltzerooper talked with his wife by phone since she was not in the hospital.  Preparation:  The patient was taken directly back to the OR and the correct patient, correct operation were confirmed after reviewing the medical record and catheterization. The consent was signed by me. Preoperative antibiotics were given. A pulmonary arterial line and left brachial arterial line were placed by the anesthesia team. The patient already had a right femoral arterial line placed in the cath lab. The patient was positioned supine on the operating room table. After being placed under general endotracheal anesthesia by the anesthesia team a foley catheter was placed. The neck, chest, abdomen, and both legs were prepped with betadine soap and solution and draped in the usual sterile manner. A surgical time-out was taken and the correct patient and operative procedure were confirmed with the nursing and anesthesia staff.  TEE: performed by Dr. Kipp Broodavid Joslin. This showed severe LV systolic dysfunction with akinesis of the inferior and lateral walls. There was a flail anterior mitral leaflet due to ruptured papillary muscle with severe MR. There was no AS or AI. Impella in good position.   Cardiopulmonary Bypass:  A median sternotomy was performed. The pericardium was opened in the midline. Right ventricular function appeared normal. The ascending aorta was of normal size and had no palpable plaque. There were no contraindications to aortic cannulation or cross-clamping. The patient was fully systemically heparinized and the ACT was maintained > 400 sec. The proximal aortic arch was cannulated with a 22 F aortic cannula for arterial inflow. Venous cannulation was performed using bi-caval cannulation with a 24 F metal tip right angle  cannula in the SVC and a 34 F malleable plastic cannula in the IVC. An antegrade cardioplegia/vent cannula was inserted into the mid-ascending aorta. A retrograde  cardioplegia cannula was placed in the coronary sinus via the right atrium. Aortic occlusion was performed with a single cross-clamp. The Impella was pulled back into the descending aorta before clamping the aorta.  Systemic cooling to 32 degrees Centigrade and topical cooling of the heart with iced saline were used. Hyperkalemic antegrade cold blood cardioplegia was used to induce diastolic arrest and then cold blood retrograde cardioplegia was then given at about 20 minute intervals throughout the period of arrest to maintain myocardial temperature at or below 10 degrees centigrade. A temperature probe was inserted into the interventricular septum and an insulating pad was placed in the pericardium.  Mitral Valve Replacement:   The left atrium was opened through a vertical incision in the interatrial groove. Exposure was difficult due to the deep chest and small atrium. Valve inspection showed rupture of the posterior-medial head of the papillary muscle to the anterior leaflet with complete flail of the anterior leaflet.  The anterior and posterior leaflet were excised.   A series of pledgetted 2-0 Ethibond sutures were placed around the mitral annulus. A 25 mm Edwards Magna-Ease valve was chosen (model [id number], [place] [id number]). The sutures were placed through the sewing ring and it was lowered into place. The sutures were tied. The valve was tested with saline and there was no regurgitation. The atrium was closed with 2 layers of continuous 3-0 prolene suture.   Completion:  The patient was rewarmed to 37 degrees Centigrade.  The crossclamp was removed with a time of 134 minutes. There was spontaneous return of sinus rhythm.  Two temporary epicardial pacing wires were placed on the right atrium and two on the right ventricle. The patient was weaned from CPB on milrinone, epinephrine, vasopressin, and levophed.  CPB time was 212 minutes. Cardiac output was 5 LPM. He was on 100% FiO2 with 20 peep with sats 94%.  I tried to pass the Impella back into the LV but could not get it to cross the aortic valve after a long period of trying with multiple manipulations of the aorta. Since he was hemodynamically fairly stable on the inotropes/pressors I decided to remove it. The left common femoral artery was exposed through an oblique incision in the groin crease. The artery was controlled proximally. A 5-0 prolene pursestring suture was placed around the catheter exit site. The Impella was removed and the suture tied. There was complete hemostasis and a good pulse distal to the repair.  Heparin was fully reversed with protamine and the aortic and venous cannulas removed. Hemostasis was achieved. Mediastinal  tubes were placed. The sternum was not closed due to the large emphysematous lungs that were wet from pulmonary edema with stable hemodynamics on multiple inotropes and pressors and marginal oxygen saturation The sternum was stabilized in the open position using a 5 cc syringe at the manubrium and a 10 cc syringe at the lower sternum. An Esmark was sutured around the skin edges to cover the wound and a VAC sponge applied over this that was attached to suction.  All sponge, needle, and instrument counts were reported correct at the end of the case. The chest tubes  were connected to pleurevac suction. The patient was then transported to the surgical intensive care unit in critical condition.

## 2017-07-01 NOTE — Anesthesia Postprocedure Evaluation (Signed)
Anesthesia Post Note  Patient: Mark Rangel  Procedure(s) Performed: MITRAL VALVE (MV) REPLACEMENT (N/A Chest)     Patient location during evaluation: SICU Anesthesia Type: General Level of consciousness: sedated Pain management: pain level controlled Vital Signs Assessment: post-procedure vital signs reviewed and stable Respiratory status: patient remains intubated per anesthesia plan Cardiovascular status: unstable (BP supported by multiple vasoactive drips) Postop Assessment: no apparent nausea or vomiting Anesthetic complications: no Comments: Critically ill patient to SICU s/p MVR, removal of impella. Epi, NE, Vasopressin, Milrinone, Amiodarone drips.    Last Vitals:  Vitals:   05-05-2017 0200 05-05-2017 0215  BP:    Pulse:    Resp: (!) 30 (!) 0  Temp: (!) 34.6 C (!) 34.3 C  SpO2:      Last Pain:  Vitals:   05-05-2017 0100  TempSrc: Core  PainSc:                  Cecile HearingStephen Edward Turk

## 2017-07-01 NOTE — Progress Notes (Signed)
Dr Laneta SimmersBartle in room with episode of extreme hypotension. Gtt's adjusted and albumin given. Pt returned to previous vitals after ~8 minutes. Will continue to monitor.  Modena JanskyKevin Brodi Kari RN 2 Heart

## 2017-07-01 NOTE — Progress Notes (Signed)
   06/29/2017 0300  Clinical Encounter Type  Visited With Patient and family together  Visit Type Death  Referral From Nurse  Consult/Referral To Chaplain  Spiritual Encounters  Spiritual Needs Prayer;Emotional;Grief support  Stress Factors  Patient Stress Factors None identified  Family Stress Factors None identified    Responded to page. I brought family (his wife, son, and daughter-in-law) to the room that his body is in. I prayed for family at bedside.   Chaplain Intern Breck CoonsIntek Adithya Difrancesco

## 2017-07-01 NOTE — Progress Notes (Signed)
RN Mark JanskyKevin Jacqui Rangel and Mark SignsJessica Rangel auscultated for heart sounds for one minute. None heard. Dr. Laneta SimmersBartle in room.  Mark JanskyKevin Elijah Michaelis RN 2 Heart

## 2017-07-01 NOTE — Death Summary Note (Signed)
DEATH SUMMARY   Patient Details  Name: Mark Rangel MRN: [id number] DOB: 1956/01/29  Admission/Discharge Information   Admit Date:  June 26, 2017  Date of Death: Date of Death: 27-Jun-2017  Time of Death: Time of Death: 0215  Length of Stay: 1  Referring Physician: Kari Baars, MD   Reason(s) for Hospitalization  STEMI  Diagnoses  Preliminary cause of death:  Secondary Diagnoses (including complications and co-morbidities):  Active Problems:   Cardiogenic shock (HCC)   Acute inferoposterior myocardial infarction (HCC)   S/P MVR (mitral valve replacement)   Brief Hospital Course (including significant findings, care, treatment, and services provided and events leading to death)  Mark Rangel is a 62 y.o. year old male who presented to the ED with complaints of chest pain, shortness of breath, and syncope.  EKG showed sinus tachycardia with marked ST changes.  Code STEMI was initiated and he was taken directly to the cath lab with a BP in the 60s. Catheterization showed infarct involved the left circumflex artery with acute ostial occlusion.  He was noted to have LV systolic dysfunction and severely elevated LV EDP.  He was also noted to have severe mitral regurgitation felt to be due to a ruptured papillary muscle.  He was required placement of an Impella CP due to severe cardiogenic shock.  He was taken emergently to the the operating room.  He underwent Mitral Valve Replacement with a 25 mm Edwards Magna Ease Pericardial Tissue Valve.  He also had his left femoral Impella with repair of left common femoral artery.  He tolerated the procedure and was taken to the SICU in stable, but critical condition.  Patient developed severe episodes of hypotension.  Despite treatment with multiple doses of Albumin and maximization of cardiac drips, he continued to be hypotensive, his oxygen levels had decreased in the 70s, his pacemaker would no longer capture.  He had no evidence of tamponade.   Unfortunately it was felt there was no other intervention for this critically ill patient.  He passed away at 06-27-2017 at 3:11.      Pertinent Labs and Studies  Significant Diagnostic Studies Dg Chest Port 1 View  Result Date: June 27, 2017 CLINICAL DATA:  Cardiac symptoms, oxygen desaturation EXAM: PORTABLE CHEST 1 VIEW COMPARISON:  Jun 26, 2017 FINDINGS: Support devices are stable. Interstitial prominence in the lungs again noted, most pronounced in the upper lobes, right greater than left, unchanged. No effusions. No pneumothorax. IMPRESSION: No significant change. Electronically Signed   By: Charlett Nose M.D.   On: 06-27-2017 02:25   Dg Chest Port 1 View  Result Date: 26-Jun-2017 CLINICAL DATA:  Status post mitral valve replacement, intubated EXAM: PORTABLE CHEST 1 VIEW COMPARISON:  26-Jun-2017 FINDINGS: Endotracheal tube is 4.8 cm above the carina. Swan-Ganz catheter tip in the central right pulmonary artery. Changes of valve replacement. Diffuse interstitial prominence throughout the lungs, right greater than left, likely asymmetric edema. More confluent opacity in the right upper lobe and left upper lobe, slightly improved since prior study. No pneumothorax. IMPRESSION: Postoperative changes. Endotracheal tube 4.8 cm above the carina. Interstitial prominence, right greater than left with focal opacity in the right upper lobe and left upper lobe slightly improved. Findings likely reflect asymmetric edema. Cannot exclude pneumonia. Electronically Signed   By: Charlett Nose M.D.   On: 26-Jun-2017 23:38   Dg Chest Port 1 View  Result Date: 26-Jun-2017 CLINICAL DATA:  Intubation. EXAM: PORTABLE CHEST 1 VIEW COMPARISON:  Chest x-ray dated January 02, 2017. FINDINGS: Endotracheal tube  in place with the tip approximately 4.7 cm above the level of the carina. Femoral catheters are noted in the main pulmonary artery and aorta/left ventricle. Borderline cardiomegaly with pulmonary vascular congestion and  interstitial edema. Consolidation in the right upper lobe. Mild patchy opacity in the left mid lung. No pleural effusion or pneumothorax. No acute osseous abnormality. IMPRESSION: 1. Appropriately positioned endotracheal tube. 2. Pulmonary vascular congestion and interstitial edema. Consolidation in the right upper lobe and patchy opacity in the left mid lung could reflect asymmetric pulmonary edema given history of STEMI. Electronically Signed   By: Obie DredgeWilliam T Derry M.D.   On: 05/08/2017 15:12    Microbiology No results found for this or any previous visit (from the past 240 hour(s)).  Lab Basic Metabolic Panel: Recent Labs  Lab 05/29/2017 1300  05/16/2017 1901 05/04/2017 2015 05/10/2017 2104 05/16/2017 2131 05/17/2017 2320 2017/05/13 0205  NA 134*   < > 141 140 142 144 144 164*  K 3.4*   < > 3.3* 4.1 3.2* 3.0* 3.4* 2.1*  CL 99*   < > 98* 99* 100* 100*  --  93*  CO2 16*  --   --   --   --   --   --   --   GLUCOSE 440*   < > 424* 389* 375* 366* 315* 278*  BUN 18   < > 26* 26* 27* 26*  --  17  CREATININE 1.47*   < > 1.50* 1.50* 1.50* 1.60*  --  1.40*  CALCIUM 8.5*  --   --   --   --   --   --   --    < > = values in this interval not displayed.   Liver Function Tests: Recent Labs  Lab 05/20/2017 1300  AST 68*  ALT 45  ALKPHOS 61  BILITOT 1.2  PROT 5.9*  ALBUMIN 3.2*   No results for input(s): LIPASE, AMYLASE in the last 168 hours. No results for input(s): AMMONIA in the last 168 hours. CBC: Recent Labs  Lab 05/18/2017 1300  05/09/2017 1926  05/17/2017 2104 05/27/2017 2131 05/04/2017 2320 05/05/2017 2323 2017/05/13 0205  WBC 16.9*  --   --   --   --   --   --  13.5*  --   NEUTROABS 11.5*  --   --   --   --   --   --   --   --   HGB 13.7   < > 9.3*   < > 10.5* 10.9* 10.5* 11.6* 5.8*  HCT 41.9   < > 28.1*   < > 31.0* 32.0* 31.0* 36.2* 17.0*  MCV 93.9  --   --   --   --   --   --  93.5  --   PLT 233  --  114*  --   --   --   --  100*  --    < > = values in this interval not displayed.   Cardiac  Enzymes: Recent Labs  Lab 05/26/2017 1300  TROPONINI 4.13*   Sepsis Labs: Recent Labs  Lab 05/21/2017 1300 05/07/2017 2323  WBC 16.9* 13.5*    Procedures/Operations   1. Median Sternotomy 2. Extracorporeal circulation 3.   Mitral valve replacement using a 25 mm Edwards Magna-Ease pericardial valve 4.   Removal of left femoral Impella with repair of left common femoral artery.     Kashmere Staffa 06/05/2017, 8:45 AM

## 2017-07-01 NOTE — Progress Notes (Signed)
Dr. Laneta SimmersBartle paged due to drastic drop in HR, BP, and SPO2. On the way to bedside.  Will continue to monitor closely. Modena JanskyKevin Hermen Mario RN

## 2017-07-01 NOTE — Progress Notes (Signed)
Dr. Laneta SimmersBartle spoke with regarding current vital signs and ABG results. Orders received and enacted. Will continue to monitor closely. Modena JanskyKevin Shakevia Sarris RN 2 Heart

## 2017-07-01 NOTE — Progress Notes (Signed)
[address]  180 Nimbex 35 Versed All wasted in sink with RN Aviva SignsJessica Dunn. Modena JanskyKevin Marinus Eicher RN 2 Heart

## 2017-07-01 DEATH — deceased
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
## 2018-12-09 IMAGING — DX DG CHEST 1V PORT
1 series · 1 of 1 positions shown · non-contrast
Comparison: Chest x-ray dated January 02, 2017.

CLINICAL DATA: Intubation.

EXAM:
PORTABLE CHEST 1 VIEW

[chest ap]
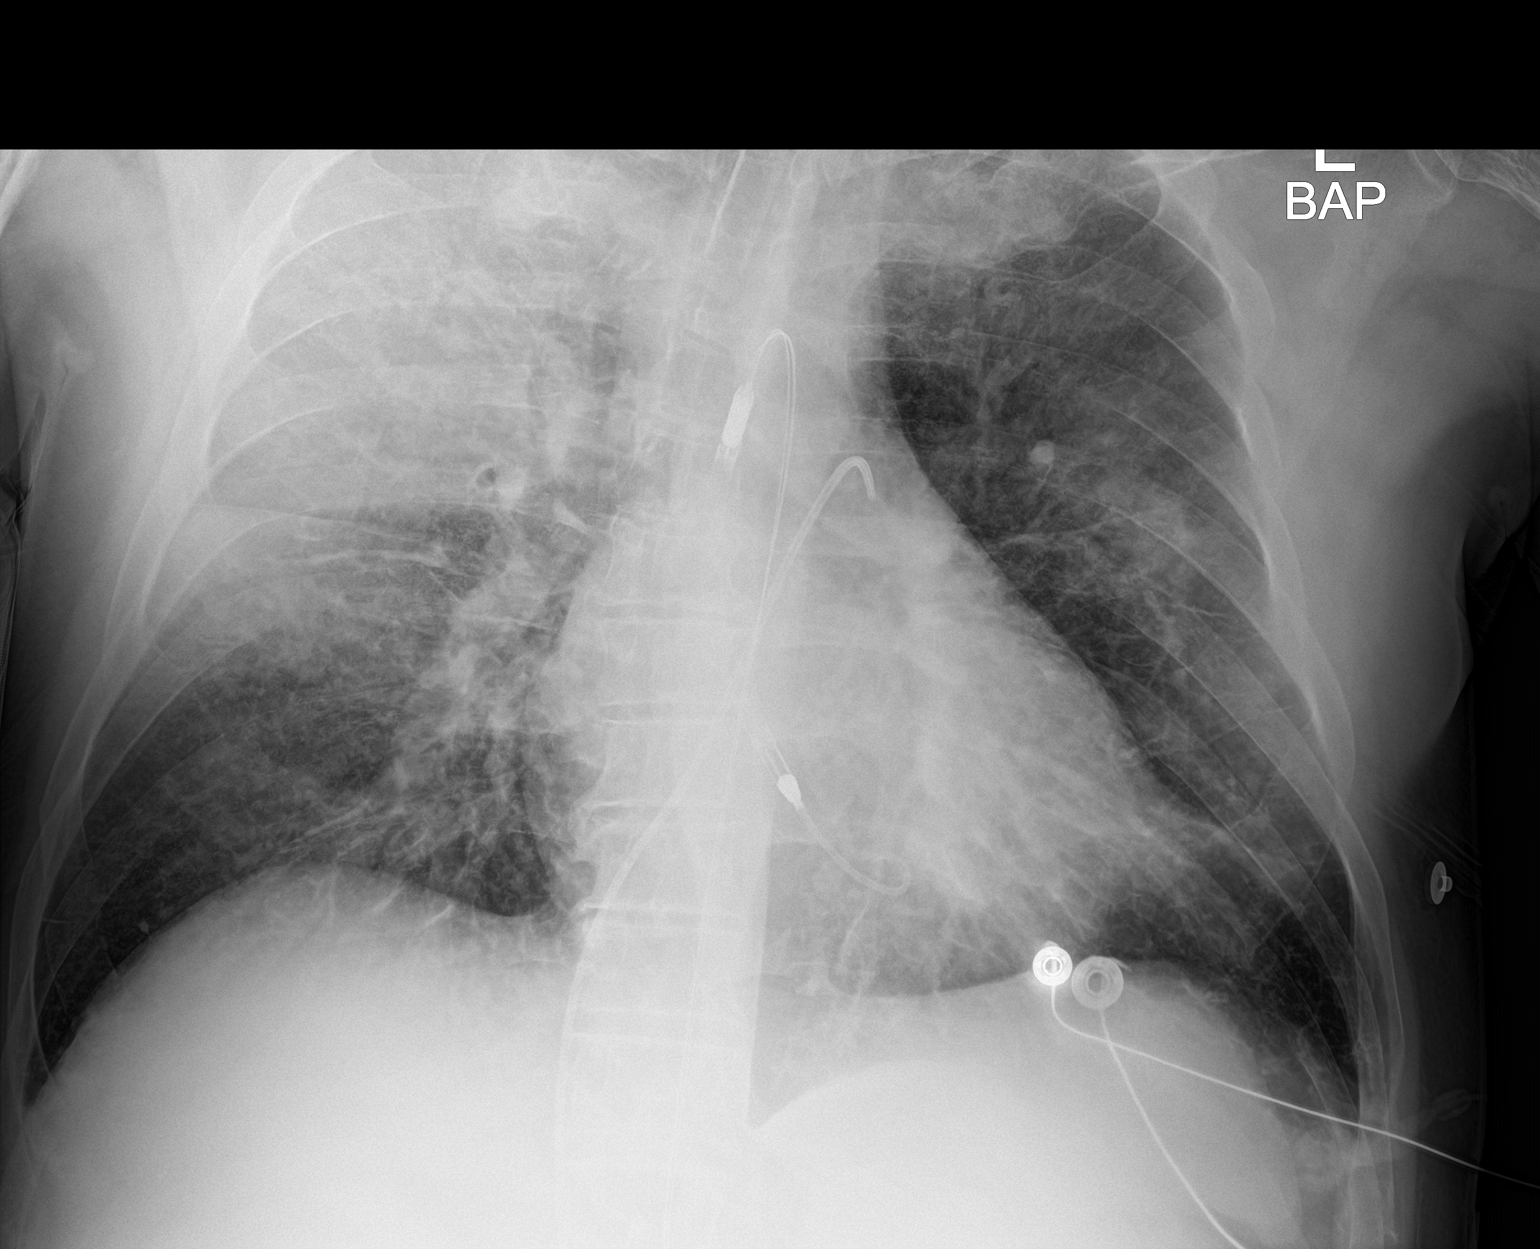

[1 of 1 positions shown; findings below may reference images not displayed]

FINDINGS: Endotracheal tube in place with the tip approximately 4.7 cm above
the level of the carina. Femoral catheters are noted in the main
pulmonary artery and aorta/left ventricle.

Borderline cardiomegaly with pulmonary vascular congestion and
interstitial edema. Consolidation in the right upper lobe. Mild
patchy opacity in the left mid lung. No pleural effusion or
pneumothorax. No acute osseous abnormality.
IMPRESSION: 1. Appropriately positioned endotracheal tube.
2. Pulmonary vascular congestion and interstitial edema.
Consolidation in the right upper lobe and patchy opacity in the left
mid lung could reflect asymmetric pulmonary edema given history of
STEMI.
